# Patient Record
Sex: Female | Born: 1975 | Race: Black or African American | Hispanic: No | Marital: Married | Smoking: Current every day smoker
Health system: Southern US, Community
[De-identification: ages and names within clinical notes are randomized; demographics above are authoritative.]

## PROBLEM LIST (undated history)

## (undated) DIAGNOSIS — K219 Gastro-esophageal reflux disease without esophagitis: Secondary | ICD-10-CM

## (undated) DIAGNOSIS — F419 Anxiety disorder, unspecified: Secondary | ICD-10-CM

## (undated) DIAGNOSIS — I10 Essential (primary) hypertension: Secondary | ICD-10-CM

## (undated) DIAGNOSIS — F32A Depression, unspecified: Secondary | ICD-10-CM

## (undated) DIAGNOSIS — G4730 Sleep apnea, unspecified: Secondary | ICD-10-CM

## (undated) DIAGNOSIS — R51 Headache: Secondary | ICD-10-CM

## (undated) DIAGNOSIS — F329 Major depressive disorder, single episode, unspecified: Secondary | ICD-10-CM

## (undated) DIAGNOSIS — R519 Headache, unspecified: Secondary | ICD-10-CM

## (undated) HISTORY — DX: Depression, unspecified: F32.A

## (undated) HISTORY — DX: Essential (primary) hypertension: I10

## (undated) HISTORY — DX: Sleep apnea, unspecified: G47.30

## (undated) HISTORY — PX: BACK SURGERY: SHX140

## (undated) HISTORY — DX: Major depressive disorder, single episode, unspecified: F32.9

## (undated) HISTORY — PX: CHOLECYSTECTOMY: SHX55

## (undated) HISTORY — PX: COLONOSCOPY: SHX174

## (undated) HISTORY — DX: Headache, unspecified: R51.9

## (undated) HISTORY — DX: Gastro-esophageal reflux disease without esophagitis: K21.9

## (undated) HISTORY — PX: UPPER GASTROINTESTINAL ENDOSCOPY: SHX188

## (undated) HISTORY — DX: Headache: R51

## (undated) HISTORY — PX: ABDOMINAL HYSTERECTOMY: SHX81

---

## 1998-02-08 ENCOUNTER — Emergency Department (HOSPITAL_COMMUNITY): Admission: EM | Admit: 1998-02-08 | Discharge: 1998-02-08 | Payer: Self-pay

## 1998-02-17 ENCOUNTER — Emergency Department (HOSPITAL_COMMUNITY): Admission: EM | Admit: 1998-02-17 | Discharge: 1998-02-17 | Payer: Self-pay | Admitting: Emergency Medicine

## 1998-03-19 ENCOUNTER — Other Ambulatory Visit: Admission: RE | Admit: 1998-03-19 | Discharge: 1998-03-19 | Payer: Self-pay | Admitting: Obstetrics & Gynecology

## 2000-05-23 ENCOUNTER — Other Ambulatory Visit: Admission: RE | Admit: 2000-05-23 | Discharge: 2000-05-23 | Payer: Self-pay | Admitting: Family Medicine

## 2000-07-18 ENCOUNTER — Other Ambulatory Visit: Admission: RE | Admit: 2000-07-18 | Discharge: 2000-07-18 | Payer: Self-pay | Admitting: Obstetrics and Gynecology

## 2001-02-02 ENCOUNTER — Inpatient Hospital Stay (HOSPITAL_COMMUNITY): Admission: AD | Admit: 2001-02-02 | Discharge: 2001-02-05 | Payer: Self-pay | Admitting: Obstetrics and Gynecology

## 2001-11-22 ENCOUNTER — Other Ambulatory Visit: Admission: RE | Admit: 2001-11-22 | Discharge: 2001-11-22 | Payer: Self-pay | Admitting: Obstetrics and Gynecology

## 2001-12-26 ENCOUNTER — Encounter: Payer: Self-pay | Admitting: Obstetrics and Gynecology

## 2001-12-26 ENCOUNTER — Ambulatory Visit (HOSPITAL_COMMUNITY): Admission: RE | Admit: 2001-12-26 | Discharge: 2001-12-26 | Payer: Self-pay | Admitting: Obstetrics and Gynecology

## 2002-03-18 ENCOUNTER — Inpatient Hospital Stay (HOSPITAL_COMMUNITY): Admission: AD | Admit: 2002-03-18 | Discharge: 2002-03-18 | Payer: Self-pay | Admitting: Obstetrics and Gynecology

## 2002-05-07 ENCOUNTER — Inpatient Hospital Stay (HOSPITAL_COMMUNITY): Admission: AD | Admit: 2002-05-07 | Discharge: 2002-05-07 | Payer: Self-pay | Admitting: Obstetrics and Gynecology

## 2002-05-22 ENCOUNTER — Inpatient Hospital Stay (HOSPITAL_COMMUNITY): Admission: AD | Admit: 2002-05-22 | Discharge: 2002-05-22 | Payer: Self-pay | Admitting: Obstetrics and Gynecology

## 2002-05-22 ENCOUNTER — Encounter: Payer: Self-pay | Admitting: Obstetrics and Gynecology

## 2002-05-25 ENCOUNTER — Encounter (INDEPENDENT_AMBULATORY_CARE_PROVIDER_SITE_OTHER): Payer: Self-pay | Admitting: *Deleted

## 2002-05-25 ENCOUNTER — Inpatient Hospital Stay (HOSPITAL_COMMUNITY): Admission: AD | Admit: 2002-05-25 | Discharge: 2002-05-27 | Payer: Self-pay | Admitting: Obstetrics and Gynecology

## 2002-05-30 ENCOUNTER — Encounter: Payer: Self-pay | Admitting: Obstetrics and Gynecology

## 2002-05-30 ENCOUNTER — Inpatient Hospital Stay (HOSPITAL_COMMUNITY): Admission: AD | Admit: 2002-05-30 | Discharge: 2002-06-01 | Payer: Self-pay | Admitting: Obstetrics and Gynecology

## 2002-05-31 ENCOUNTER — Encounter: Payer: Self-pay | Admitting: Cardiology

## 2002-05-31 ENCOUNTER — Encounter: Payer: Self-pay | Admitting: Obstetrics and Gynecology

## 2003-02-13 ENCOUNTER — Encounter: Payer: Self-pay | Admitting: Obstetrics and Gynecology

## 2003-02-13 ENCOUNTER — Inpatient Hospital Stay (HOSPITAL_COMMUNITY): Admission: AD | Admit: 2003-02-13 | Discharge: 2003-02-13 | Payer: Self-pay | Admitting: Obstetrics and Gynecology

## 2003-02-28 ENCOUNTER — Encounter: Payer: Self-pay | Admitting: Obstetrics and Gynecology

## 2003-02-28 ENCOUNTER — Ambulatory Visit (HOSPITAL_COMMUNITY): Admission: RE | Admit: 2003-02-28 | Discharge: 2003-02-28 | Payer: Self-pay | Admitting: Obstetrics and Gynecology

## 2003-02-28 ENCOUNTER — Encounter (INDEPENDENT_AMBULATORY_CARE_PROVIDER_SITE_OTHER): Payer: Self-pay | Admitting: Specialist

## 2003-03-29 ENCOUNTER — Encounter: Admission: RE | Admit: 2003-03-29 | Discharge: 2003-03-29 | Payer: Self-pay | Admitting: Neurosurgery

## 2003-04-16 ENCOUNTER — Encounter: Admission: RE | Admit: 2003-04-16 | Discharge: 2003-04-16 | Payer: Self-pay | Admitting: Neurosurgery

## 2003-05-29 ENCOUNTER — Encounter: Admission: RE | Admit: 2003-05-29 | Discharge: 2003-05-29 | Payer: Self-pay | Admitting: Neurosurgery

## 2003-06-19 ENCOUNTER — Ambulatory Visit (HOSPITAL_COMMUNITY): Admission: RE | Admit: 2003-06-19 | Discharge: 2003-06-19 | Payer: Self-pay | Admitting: Obstetrics and Gynecology

## 2003-09-11 ENCOUNTER — Encounter
Admission: RE | Admit: 2003-09-11 | Discharge: 2003-12-10 | Payer: Self-pay | Admitting: Physical Medicine and Rehabilitation

## 2003-09-26 ENCOUNTER — Encounter
Admission: RE | Admit: 2003-09-26 | Discharge: 2003-12-25 | Payer: Self-pay | Admitting: Physical Medicine and Rehabilitation

## 2003-12-24 ENCOUNTER — Encounter
Admission: RE | Admit: 2003-12-24 | Discharge: 2004-01-29 | Payer: Self-pay | Admitting: Physical Medicine and Rehabilitation

## 2004-01-29 ENCOUNTER — Encounter
Admission: RE | Admit: 2004-01-29 | Discharge: 2004-03-24 | Payer: Self-pay | Admitting: Physical Medicine and Rehabilitation

## 2004-01-31 ENCOUNTER — Ambulatory Visit: Payer: Self-pay | Admitting: Physical Medicine and Rehabilitation

## 2004-03-10 ENCOUNTER — Encounter
Admission: RE | Admit: 2004-03-10 | Discharge: 2004-06-08 | Payer: Self-pay | Admitting: Physical Medicine and Rehabilitation

## 2004-03-24 ENCOUNTER — Encounter
Admission: RE | Admit: 2004-03-24 | Discharge: 2004-06-01 | Payer: Self-pay | Admitting: Physical Medicine and Rehabilitation

## 2004-06-01 ENCOUNTER — Encounter
Admission: RE | Admit: 2004-06-01 | Discharge: 2004-08-26 | Payer: Self-pay | Admitting: Physical Medicine and Rehabilitation

## 2004-06-03 ENCOUNTER — Ambulatory Visit: Payer: Self-pay | Admitting: Physical Medicine and Rehabilitation

## 2004-07-01 ENCOUNTER — Ambulatory Visit: Payer: Self-pay | Admitting: Physical Medicine and Rehabilitation

## 2004-07-14 ENCOUNTER — Inpatient Hospital Stay (HOSPITAL_COMMUNITY): Admission: AD | Admit: 2004-07-14 | Discharge: 2004-07-14 | Payer: Self-pay | Admitting: *Deleted

## 2004-07-30 ENCOUNTER — Ambulatory Visit: Payer: Self-pay | Admitting: Physical Medicine and Rehabilitation

## 2004-08-12 ENCOUNTER — Ambulatory Visit (HOSPITAL_COMMUNITY): Admission: RE | Admit: 2004-08-12 | Discharge: 2004-08-12 | Payer: Self-pay | Admitting: Neurosurgery

## 2004-08-26 ENCOUNTER — Encounter
Admission: RE | Admit: 2004-08-26 | Discharge: 2004-11-24 | Payer: Self-pay | Admitting: Physical Medicine and Rehabilitation

## 2004-10-06 ENCOUNTER — Ambulatory Visit (HOSPITAL_COMMUNITY): Admission: RE | Admit: 2004-10-06 | Discharge: 2004-10-07 | Payer: Self-pay | Admitting: Neurosurgery

## 2005-06-11 ENCOUNTER — Ambulatory Visit (HOSPITAL_COMMUNITY): Admission: RE | Admit: 2005-06-11 | Discharge: 2005-06-11 | Payer: Self-pay | Admitting: Nephrology

## 2005-06-16 ENCOUNTER — Ambulatory Visit (HOSPITAL_COMMUNITY): Admission: RE | Admit: 2005-06-16 | Discharge: 2005-06-16 | Payer: Self-pay | Admitting: Nephrology

## 2005-12-20 ENCOUNTER — Encounter (INDEPENDENT_AMBULATORY_CARE_PROVIDER_SITE_OTHER): Payer: Self-pay | Admitting: Specialist

## 2005-12-20 ENCOUNTER — Inpatient Hospital Stay (HOSPITAL_COMMUNITY): Admission: RE | Admit: 2005-12-20 | Discharge: 2005-12-22 | Payer: Self-pay | Admitting: Obstetrics and Gynecology

## 2006-06-03 ENCOUNTER — Emergency Department (HOSPITAL_COMMUNITY): Admission: EM | Admit: 2006-06-03 | Discharge: 2006-06-03 | Payer: Self-pay | Admitting: Emergency Medicine

## 2006-06-06 ENCOUNTER — Emergency Department (HOSPITAL_COMMUNITY): Admission: EM | Admit: 2006-06-06 | Discharge: 2006-06-06 | Payer: Self-pay | Admitting: Emergency Medicine

## 2006-06-24 ENCOUNTER — Encounter: Admission: RE | Admit: 2006-06-24 | Discharge: 2006-06-24 | Payer: Self-pay | Admitting: Gastroenterology

## 2006-08-10 ENCOUNTER — Emergency Department (HOSPITAL_COMMUNITY): Admission: EM | Admit: 2006-08-10 | Discharge: 2006-08-10 | Payer: Self-pay | Admitting: Emergency Medicine

## 2006-08-22 ENCOUNTER — Encounter: Admission: RE | Admit: 2006-08-22 | Discharge: 2006-08-22 | Payer: Self-pay | Admitting: Nephrology

## 2006-08-25 ENCOUNTER — Encounter: Admission: RE | Admit: 2006-08-25 | Discharge: 2006-08-25 | Payer: Self-pay | Admitting: Nephrology

## 2006-10-02 ENCOUNTER — Encounter: Admission: RE | Admit: 2006-10-02 | Discharge: 2006-10-02 | Payer: Self-pay | Admitting: Nephrology

## 2006-10-31 ENCOUNTER — Emergency Department (HOSPITAL_COMMUNITY): Admission: EM | Admit: 2006-10-31 | Discharge: 2006-10-31 | Payer: Self-pay | Admitting: Family Medicine

## 2007-03-19 ENCOUNTER — Emergency Department (HOSPITAL_COMMUNITY): Admission: EM | Admit: 2007-03-19 | Discharge: 2007-03-19 | Payer: Self-pay | Admitting: Family Medicine

## 2007-12-30 ENCOUNTER — Emergency Department (HOSPITAL_COMMUNITY): Admission: EM | Admit: 2007-12-30 | Discharge: 2007-12-30 | Payer: Self-pay | Admitting: Emergency Medicine

## 2008-02-02 ENCOUNTER — Encounter: Admission: RE | Admit: 2008-02-02 | Discharge: 2008-02-02 | Payer: Self-pay | Admitting: Nephrology

## 2009-03-28 ENCOUNTER — Encounter: Admission: RE | Admit: 2009-03-28 | Discharge: 2009-03-28 | Payer: Self-pay | Admitting: Family Medicine

## 2009-04-15 ENCOUNTER — Ambulatory Visit (HOSPITAL_COMMUNITY): Admission: EM | Admit: 2009-04-15 | Discharge: 2009-04-16 | Payer: Self-pay | Admitting: Emergency Medicine

## 2010-06-14 ENCOUNTER — Encounter: Payer: Self-pay | Admitting: Nephrology

## 2010-08-26 LAB — COMPREHENSIVE METABOLIC PANEL
Alkaline Phosphatase: 70 U/L (ref 39–117)
BUN: 5 mg/dL — ABNORMAL LOW (ref 6–23)
Calcium: 9.4 mg/dL (ref 8.4–10.5)
Creatinine, Ser: 0.69 mg/dL (ref 0.4–1.2)
Glucose, Bld: 107 mg/dL — ABNORMAL HIGH (ref 70–99)
Potassium: 3.8 mEq/L (ref 3.5–5.1)
Total Protein: 7 g/dL (ref 6.0–8.3)

## 2010-08-26 LAB — DIFFERENTIAL
Basophils Relative: 0 % (ref 0–1)
Lymphocytes Relative: 15 % (ref 12–46)
Monocytes Relative: 7 % (ref 3–12)
Neutro Abs: 7.2 10*3/uL (ref 1.7–7.7)
Neutrophils Relative %: 77 % (ref 43–77)

## 2010-08-26 LAB — CBC
HCT: 40.5 % (ref 36.0–46.0)
Hemoglobin: 13.6 g/dL (ref 12.0–15.0)
MCHC: 33.6 g/dL (ref 30.0–36.0)
MCV: 93.7 fL (ref 78.0–100.0)
Platelets: 147 10*3/uL — ABNORMAL LOW (ref 150–400)
RDW: 14 % (ref 11.5–15.5)

## 2010-08-26 LAB — URINALYSIS, ROUTINE W REFLEX MICROSCOPIC
Glucose, UA: NEGATIVE mg/dL
Protein, ur: NEGATIVE mg/dL
Specific Gravity, Urine: 1.012 (ref 1.005–1.030)
Urobilinogen, UA: 0.2 mg/dL (ref 0.0–1.0)

## 2010-10-09 NOTE — Op Note (Signed)
NAME:  Carla Wagner, Carla Wagner NO.:  [id number]   MEDICAL RECORD NO.:  [id number]                   PATIENT TYPE:  AMB   LOCATION:  DAY                                  FACILITY:  [facility]   PHYSICIAN:  Malachi Pro. Ambrose Mantle, M.D.              DATE OF BIRTH:  1976/04/26   DATE OF PROCEDURE:  02/28/2003  DATE OF DISCHARGE:                                 OPERATIVE REPORT   PREOPERATIVE DIAGNOSIS:  Incomplete abortion.   POSTOPERATIVE DIAGNOSIS:  Incomplete abortion.   OPERATION:  Suction dilatation and curettage.   OPERATOR:  Malachi Pro. Ambrose Mantle, M.D.   General anesthesia.   The patient was brought to the operating room and placed under satisfactory  general anesthesia and placed in lithotomy position.  Exam revealed the  uterus to be anterior, upper limit of normal size.  There were no adnexal  masses.  The cervix was closed.  There was blood in the vagina.  The vulva,  vagina, perineum, and urethra were prepped with Betadine solution and draped  as a sterile field.  By history the patient had undergone postpartum tubal  ligation on May 25, 2002.  She had an ultrasound on February 07, 2003,  that showed a fetal pole and a gestational sac equivalent to 5 weeks 6 days  pregnancy.  The pregnancy was intrauterine.  She had had three beta HCGs.  They were declining.  She continued to have bleeding, and an ultrasound on  February 26, 2003, showed what appeared to a gestational sac although it was  somewhat irregular, it could have just represented fluid, and there was no  fetal pole and no yolk sac.  Obviously there had been no progression in  almost three weeks from the time she had had a 5 week 6 day intrauterine  pregnancy seen at [facility].  The cervix was grasped with a  tenaculum, drawn into the operative field.  The uterus sounded to 11 cm  anteriorly.  The cervical canal was dilated to a 31 Pratt dilator.  A #8  curved suction curette was inserted and a  suction D&C was done, producing  tissue.  After I thought the tissue had been removed, we added Pitocin to  the IV bottle, used a sharp curette to ensure that the cavity walls felt  smooth, did one more circuit with the suction instrument, and terminated the  procedure as far as the suction was concerned.  There was significant  bleeding from the tenaculum sites, so I placed a 2-0 Vicryl suture at each  tenaculum site and controlled all the bleeding.  The procedure was  terminated.  Blood loss was about 100 mL.  Sponge and needle counts were  correct.  The patient was returned to recovery in satisfactory condition.  Malachi Pro. Ambrose Mantle, M.D.    TFH/MEDQ  D:  02/28/2003  T:  02/28/2003  Job:  [id number]

## 2010-10-09 NOTE — Assessment & Plan Note (Signed)
MEDICAL RECORD NUMBER:  [id number].   Carla Wagner is a 35 year old single black female who is a patient of Dr.  Cassandria Santee.   Carla Wagner is being seen in our pain and rehabilitative clinic for chronic  low back pain and right sciatic symptoms. These have persisted for her since  she has been initially seen her back last April of 2005.   She describes her average pain as about a 7 on a scale of 10. Describes it  as burning, stabbing, tingling, and aching. Ninety percent of her pain is  right leg pain, 10% is in her low back.   Sleep is overall fair. Pain is worsened by bending and sitting type  activities. Improves with heat, rest, therapy, and medications. Relief from  medications has overall been good although she is getting somewhat  frustrated with her persistent leg pain and feels her activities are limited  by the leg pain.   She is currently not employed. She has three children. She is independent  with all of her self care. She is interested in getting a job outside the  home, however.   Denies bowel or bladder control problems. Denies any suicidal ideation. Does  admit to anxiety and depression, however.   No new changes in review of systems, past medical, social, or family  history.   PHYSICAL EXAMINATION:  Blood pressure 124/69, pulse 61, respirations 16,  100% saturated on room air. Carla Wagner is a tall, well-developed, well-  nourished female, slightly obese. She does not appear in any distress during  our interview. She is oriented to person, place, and date. Her affect is  overall bright and alert. She is able to stand independently from a seated  position. Her gait is overall nonantalgic. She has increased low back pain  with forward flexion, not as much with extension or lateral flexion. Seated,  reflexes are 0 to 1+ at the knees and ankles. No increased tone is noted.  She has a positive straight leg raise on the right, negative on the left.  She does have some  gastroc weakness noted with two raises on the right. No  EHL weakness is noted, however.   IMPRESSION:  1.  Lumbago, 10% of problem.  2.  Sciatica, 90% of problem.   We will refill patient's medications today, Lidoderm 5% 1 to 3 patches 12  hours on/12 hours off as directed #90, Norco 5/325 one p.o. t.i.d. #90,  Prilosec 20 mg 1 p.o. q.d. #30. She also has reported that the Elavil is  helpful, but she did not need a refill on this today as well Topamax has  been quite helpful, and she will not need a refill on this to date. She is  taking 25 mg 1 p.o. b.i.d.   Apparently, Carla Wagner is getting frustrated with her continued right leg  pain. She is planning to follow up with Dr. Jeral Fruit in apparently the end of  March. We will send a copy of our note to Dr. Hilda Lias, [address], [address], [place], [place] [address].     DMK/MedQ  D:  07/03/2004 12:07:58  T:  07/03/2004 14:55:03  Job #:  [id number]

## 2010-10-09 NOTE — Assessment & Plan Note (Signed)
MEDICAL RECORD NUMBER:  [id number].   [name] is a patient of Dr. Cassandria Santee. She is back in for a  recheck and refill of her medications. She was last seen on March. At that  time, she had an appointment set up with Dr. Jeral Fruit. She saw him yesterday.  He is planning a surgical procedure for her lumbar spine on May 9 or 10  apparently.   She has had sciatic pain in her right pain over a year now. She has been  managed conservatively with therapy, medications, and has persistent leg  pain. Average pain is about a 7 on a scale of 10. Sleep is fair. Relief of  medications has been good lately. She recently started on Naprosyn. She  tolerated this well, and this did help her quite a bit last month.   Able to walk about 60 minutes at a time. Currently not employed. Has two  children at home. No problems controlling bowel or bladder. Does admit to  anxiety.   No new changes in past medical, social, or family history other than that  described above.   PHYSICAL EXAMINATION:  Blood pressure 132/82, pulse 74, respirations 16,  100% saturated on room air. She is alert, oriented, cooperative, pleasant  female. Affect overall bright. Oriented x3.   She is able to stand independently from a seated position. Gait is initially  slightly antalgic. There is limitations in lumbar range of motion. Seated  straight leg raise is positive on the right. She has numbness in the right  S1 distribution. She has 5/5 strength, however, in both lower extremities.   IMPRESSION:  1.  Lumbago.  2.  Sciatica.   PLAN:  Refill of the following medications:  1.  Naprosyn 500 mg 1 p.o. q.12h. #60.  2.  Norvasc 5/325 one p.o. t.i.d. #90.  3.  Topamax 25 mg 1 p.o. b.i.d. #60.  4.  Effexor samples are given today.  5.  Lidoderm 5% 1 to 3 patches 12 hours on and 12 hours off, #60.   Will see her back in a month.      DMK/MedQ  D:  08/28/2004 11:29:31  T:  08/28/2004 15:56:30  Job #:  [id number]

## 2010-10-09 NOTE — Op Note (Signed)
NAME:  Carla Wagner, Carla Wagner NO.:  [id number]   MEDICAL RECORD NO.:  [id number]                   PATIENT TYPE:  AMB   LOCATION:  DAY                                  FACILITY:  [facility]   PHYSICIAN:  Malachi Pro. Ambrose Mantle, M.D.              DATE OF BIRTH:  1975-08-28   DATE OF PROCEDURE:  06/19/2003  DATE OF DISCHARGE:                                 OPERATIVE REPORT   PREOPERATIVE DIAGNOSIS:  Voluntary sterilization.   POSTOPERATIVE DIAGNOSIS:  Voluntary sterilization.   OPERATION:  Laparoscopic tubal cauterization.   OPERATOR:  Malachi Pro. Ambrose Mantle, M.D.   ANESTHESIA:  General.   DESCRIPTION OF PROCEDURE:  The patient was brought to the operating room and  placed under satisfactory general anesthesia and placed in the [facility]  stirrups.  The vulva, vagina, and urethra were prepped with Betadine  solution.  The abdomen was emptied with a [place] catheter.  Exam revealed  the uterus to be anterior and normal size.  The adnexa were free of masses.  A Hulka cannula was placed into the uterus and attached to the anterior  cervical lip.  The abdomen was then prepped with Betadine solution and  draped as a sterile field.  Because the patient had had a previous tubal  procedure through the umbilicus, I chose to do an open laparoscopy, but the  distance from the umbilicus to the pubis was fairly short, so I chose to  enter the abdomen through a supraumbilical incision rather than  infraumbilical incision.  The patient had had a history of having her tubes  tied following her last delivery.  She then became pregnant and ultimately  required a D&C for an incomplete abortion.  Because we knew that at least 1  tube was open, that we needed to do a sterilization if she so desired, and  she did.  So, the skin above the umbilicus was grasped with Allis clamps.  An incision was made in the skin about 1 inch long, and then I carried this  down to the fascia with a ___________ clamp.   I then grasped the fascia with  2 Kochers, incised the fascia and then with blunt dissection was able to  enter the peritoneal cavity.  I placed a pursestring suture of #1 Vicryl  using a urology needle all the way around the fascial opening and then  inserted the Hasson cannula and attached the suture to the Hasson.  I  inflated the abdominal cavity and made another incision suprapubically and  inserted a smaller trocar under direct vision.  Exploration of the pelvis  revealed the ovaries to look normal.  The uterus was normal.  Both cul-de-  sacs were free of disease.  The right tube had obviously been divided, but  it appeared that you could see the endosalpinx both proximally and distally.  It appeared red rather than pink or beige-white, as the peritoneum of  the  tubal serosa should look.  The left tube had obviously been divided.  There  was no endosalpinx visible on the left.  There were some filmy adhesions  connecting the proximal end with the distal end but just judging by the  appearance, I would think that the right tube was the one that had allowed  the pregnancy to occur, but it was impossible to say.  So I cauterized the  proximal aspect of both fallopian tubes with 3-4 continuous blasts of  current until the meter read zero, and I also coagulated a short segment of  the distal right fallopian tube where I could see the endosalpinx.  I looked  superiorly and saw the liver to be smooth without abnormality.  The  gallbladder looked normal.  I did not see any upper abdominal abnormalities.  I removed the lower abdominal trocar, saw no bleeding from this entry point,  released the pneumoperitoneum, removed the Hasson cannula, tied down the  fascia pursestring suture and closed the fascial incision in this manner.  I  then closed the subcutaneous tissue with interrupted 3-0  Vicryl and the skin with 3-0 plain.  I closed the lower abdominal puncture  wound with 1/8 inch  Steri-Strips.  I removed the Hulka cannula.  The patient  was returned to recovery in satisfactory condition.  Blood loss was less  than 10 mL.                                               Malachi Pro. Ambrose Mantle, M.D.    TFH/MEDQ  D:  06/19/2003  T:  06/19/2003  Job:  [id number]

## 2010-10-09 NOTE — Assessment & Plan Note (Signed)
Carla Wagner is a 35 year old single black female referred by Dr. Jeral Fruit for  management of her back pain.   She was last seen March 26, 2004. She had her last refill of hydrocodone  and Naprosyn on April 29, 2004.   She has had a series of three lumbar injections in the past and reported  they have helped somewhat. She is wondering if she could followup with  another injection. She is also asking about possible surgical intervention  since this has been going on for her now for over a year.   Her pain is fairly constant localized in the right low back, radiates down  the right leg, worse with bending, sitting type activities. The pain is on  the average about a 7 on a scale of 10 described as sharp, burning,  stabbing, tingling and aching.  Sleep is overall fair. She gets fairly good  relief with her medication.  Interferes a lot with her daily activities and  completely interferes with her enjoyment of life.   She is able to walk without assistance, can walk about 10 minutes at a time.  She is able to climb stairs. She is able to drive. She is independent with  her self care, does require some assistance with household duties and  shopping. She is currently not employed. Denies any suicidal ideation.   On review of systems, she reports some intermittent shortness of breath. She  will followup with her primary care physician for this particular symptom.   Denies any new problems regarding her past medical history, social history  or family history.   PHYSICAL EXAMINATION:  GENERAL:  She is an alert, oriented individual.  Her  affect is overall alert and bright.  VITAL SIGNS:  Her blood pressure is 133/76, pulse 77, respirations 18, 100%  saturated on room air.   She is able to get out of the chair without any difficulty, gait is  initially somewhat stiff but it evens out in the room.  She has some  limitations with forward flexion, extension, lateral flexion.  Seated  reflexes are 2+ at the knees, 1+ at the ankles, motor strength is 5/5, hip  flexors and knee extensors, dorsiflexors, plantar flexors, EHL, straight leg  raise increases her pain on the right.  Had her do toe raises in the room  today. She has some difficulty on the right compared to the left and has  positive straight leg raise on the right.   IMPRESSION:  1.  Lumbago.  2.  Degenerative disk disease especially at L3-4, L4-5.  3.  Sciatica with a history of right S1 root compromise secondary to L5      disk.   PLAN:  She would like to followup with [facility] and find out what her surgical  options are.  Will get her setup for a selective nerve root block again as  well.  We will refill the following medications for her today:   1.  Effexor XR 75, 1 p.o. q.d. #30.  2.  Topamax 1 p.o. b.i.d, #60.  3.  Elavil 10 mg 1 p.o. q.h.s. #30.  4.  Norco 5/325, 1 p.o. t.i.d. #90.  5.  Lidoderm patches 5%, 1-3 patches, 4 hours, 2 hours off, #90.  Will her      back in a month.       DMK/MedQ  D:  06/03/2004 17:27:18  T:  06/03/2004 21:07:59  Job #:  [id number]   cc:   Hilda Lias,  M.D.  [address]  [place], [place] [address]  Fax: [phone]

## 2010-10-09 NOTE — Assessment & Plan Note (Signed)
MEDICAL RECORD NUMBER:  [id number]   HISTORY:  [name] is a 35 year old woman with a history of degenerative  disc disease at L3-4, L4-5 and history of some right S1 root compromise  secondary to an L5-S1disc.   Her average pain is about a 4 on a scale of 10, can go up to an 8 and can  down to a 4.   She remains quite active.  She is the mother of a 10- and a 61-year-old.  She  has been going to physical therapy and has been doing some spine  stabilization work in physical therapy.   She has reported some increased pain in her low back over the last several  weeks, however, she is quite adamant and wanting to continue on a  conservative management course.  Again we offered the possibility of doing  some spine injections, epidural's and selective nerve root blocks, she is  not interested and also is following up with Dr. Jeral Fruit at this point, she  would like to wait to see if she has some improvement again.   She is specifically asking for a physical therapy program to help her with  weight loss.   She has had injections in the past, her last one was done December 2004, she  had had a total of three I believe although I do not have notes regarding  this, she reports they did help somewhat.   She has been managed currently with hydrocodone 5/325 three times a day.  She has been quite compliant.  There has been no escalation with these  medications.  We have had to put her on a couple courses of Naprosyn for  short periods of time.  She seems to do well with this.  She takes Prilosec  as well during these short courses.  She is requesting to get involved in  the water aerobics program, we will write a prescription out for her to do  that as well today.   PHYSICAL EXAMINATION:  On exam her blood pressure is 129/61, pulse 57,  respirations 14, 100% saturated on room air.   She is alert, cooperative, in good spirits overall.   Heart is in regular rhythm.  Lungs are clear.  Abdomen  is benign.   Extremities are without edema, no tremors are noted, normal tone is noted.   Her gait is slightly antalgic with slightly decreased weightbearing on the  right.  It does smooth out a little bit when she takes a few more steps in  the room.  It is however, stable.   Neurologically she has 1 to 2+ reflexes at the biceps, triceps and  brachioradialis bilaterally, 0 to 1+ at the knees and ankles bilaterally,  toes are downgoing, no clonus is noted.  We specifically test her gastroc  strength having her do toe raises in the room, there is no weakness noted at  all, she is able to do as many on the right as on the left, no new numbness  is noted.  EHL and ankle dorsiflexors and evertors are all 5/5 strength as  well.  There is no sensory deficits in the lower extremities noted either  today.   IMPRESSION:  Degenerative disc disease at L3-4, L4-5 and right S1 nerve root  compromise secondary to L5-S1 disc.  No evidence of any neurologic deficits.  No subtle weakness is noted side to side.  No sensory deficits are noted  either today.   PLAN:  We will start  her on another 10 days of Naprosyn with Prilosec.  We  will refill her Norco 5/325 one p.o. t.i.d.  We will write her a  prescription for engaging in physical therapy in a conditioning program to  help with  weight loss.  We will also give her a prescription to attend a local [employer]  for a water aerobic as well.  We will see her back in 1 month.      Brantley Stage, M.D.   DMK/MedQ  D:  12/25/2003 13:06:14  T:  12/26/2003 06:50:14  Job #:  [id number]   cc:   Hilda Lias, M.D.  [address]  [place], [place] [address]  Fax: [phone]

## 2010-10-09 NOTE — Discharge Summary (Signed)
   NAME:  Carla Wagner, CLINKENBEARD NO.:  [id number]   MEDICAL RECORD NO.:  [id number]                   PATIENT TYPE:  INP   LOCATION:  9140                                 FACILITY:  [facility]   PHYSICIAN:  Janine Limbo, M.D.            DATE OF BIRTH:  Nov 23, 1975   DATE OF ADMISSION:  05/25/2002  DATE OF DISCHARGE:  05/27/2002                                 DISCHARGE SUMMARY   ADMISSION DIAGNOSES:  1. Intrauterine pregnancy at term.  2. Active labor.  3. Desires sterilization.   PROCEDURE:  Normal spontaneous vaginal deliver and postpartum bilateral  tubal sterilization.   DISCHARGE DIAGNOSES:  1. Active labor at term delivered.  2. Meconium-stained amniotic fluid.   HOSPITAL COURSE:  The patient is a 35 year old gravida 6, para 2-0-2-2 who  presented at term with spontaneous rupture of membranes for meconium-stained  amniotic fluid.  Her labor progressed rapidly and she went on to a normal  spontaneous vaginal delivery with the birth of a 9 pound 6 ounce female infant  named [name] with Apgar scores of 8 at one-minute and 9 at five minutes.  The  patient underwent postpartum bilateral tubal sterilization on her day of  delivery and has done well in the immediate postpartum period.  Her vital  signs have remained stable.  She is afebrile.  She was anemic on admission  with hemoglobin of 9.9 and on her first postpartum day her hemoglobin was  6.7.  She has not had any dizziness or syncope, has been up and ambulated  without any difficulty, and declined blood transfusion.  She has been stable  and her baby is stable.  She is therefore in satisfactory condition for  discharge.   DISCHARGE INSTRUCTIONS:  Per [facility] handout.   DISCHARGE MEDICATIONS:  1. Motrin 600 mg p.o. q.6 h. p.r.n. pain.  2. Prenatal vitamins.  3. The patient will take iron 325 mg p.o. b.i.d.   FOLLOW UP:  Return to the office of [facility] in six weeks for followup.     Rica Koyanagi, C.N.M.               Janine Limbo, M.D.    SDM/MEDQ  D:  05/27/2002  T:  05/27/2002  Job:  [id number]

## 2010-10-09 NOTE — Op Note (Signed)
NAME:  Carla Wagner, Carla Wagner NO.:  [id number]   MEDICAL RECORD NO.:  [id number]          PATIENT TYPE:  AMB   LOCATION:  SDC                           FACILITY:  [facility]   PHYSICIAN:  Malachi Pro. Ambrose Mantle, M.D. DATE OF BIRTH:  1976/03/05   DATE OF PROCEDURE:  12/20/2005  DATE OF DISCHARGE:                                 OPERATIVE REPORT   PREOPERATIVE DIAGNOSIS:  Menorrhagia, dysmenorrhea, anemia, probable  adenomyosis.   POSTOPERATIVE DIAGNOSIS:  Menorrhagia, dysmenorrhea, anemia, probable  adenomyosis.   OPERATION:  Vaginal hysterectomy.   OPERATOR:  Malachi Pro. Ambrose Mantle, M.D.   ASSISTANT:  Zenaida Niece, M.D.   General anesthesia.   The patient was brought to the operating room and placed under satisfactory  general anesthesia.  She was placed in lithotomy position in the [place]  stirrups.  Exam revealed the uterus to the anterior upper limit of normal  size.  The adnexa were free of masses.  The vulva, vagina, perineum and  urethra were prepped with Betadine solution.  A Foley catheter was inserted  to straight drain and then the area was draped as a sterile field.  A  weighted speculum was placed posteriorly.  Deaver retractor was placed  anteriorly.  The cervix was grasped with Lahey clamps and drawn into the  operative field.  A dilute solution of Neo-Synephrine was injected at the  cervicovaginal junction, approximately 12-15 mL was injected.  This solution  was 0.4 mg of Neo-Synephrine in 250 mL of saline.  A circumferential  incision was made around the cervix.  The anterior vaginal mucosa was pushed  anteriorly and then the posterior cul-de-sac was identified and entered.  Both uterosacral ligaments were clamped, cut, suture ligated and held and  the cardinal ligaments were handled in the same fashion except they were  cut.  The anterior vaginal mucosa was pushed ahead and I made an attempt to  enter the peritoneum, was unable to do that so I placed two more  clamps  along the previous ones clamped, cut and suture ligated the clamps and then  the anterior peritoneum was entered and the bladder was retracted away.  I  confirmed the presence of the retractor in the peritoneal cavity by  identifying bowel.  I then placed a couple more clamps above this area and  suture ligated the clamps, then inverted the uterus through the cul-de-sac,  clamped across the upper pedicles and then doubly suture ligated all the  upper pedicles.  I ran the posterior vaginal cuff with a locked suture of 0  Vicryl.  However, in spite of this I still had a couple of areas of bleeding  that required a couple of extra figure-of-eight sutures of 0 Vicryl.  I  diligently searched for hemostasis, a couple of bleeders on each side above  the uterosacral ligaments were sutured with 0 Vicryl figure-of-eight  sutures.  Hemostasis was now adequate.  Both tubes and ovaries appeared  normal.  I identified the anterior peritoneal reflection.  I began a  pursestring suture of #1 Vicryl through that and carried it around to  the  left upper pedicle, the left uterosacral ligament, posterior cul-de-sac,  peritoneum, the right uterosacral ligament, the right upper pedicle and back  to the anterior pelvic peritoneum.  I left this suture untied, looked again  for hemostasis, found that the area was hemostatic, tied the pursestring  suture down ensuring that there was no significant structure in its way and  then reunited the uterosacral ligaments together in the midline by tying  down the sutures that I had placed on  them earlier.  Then reunited the vaginal mucosa with interrupted figure-of-  eight sutures of 0 Vicryl.  The patient seemed to tolerate the procedure  well.  Blood loss was about 200 mL.  Sponge and needle counts were correct.  Urine was clear at the end of the procedure.      Malachi Pro. Ambrose Mantle, M.D.  Electronically Signed     TFH/MEDQ  D:  12/20/2005  T:  12/20/2005   Job:  [id number]

## 2010-10-09 NOTE — Discharge Summary (Signed)
NAME:  Carla Wagner, Carla Wagner NO.:  [id number]   MEDICAL RECORD NO.:  [id number]                   PATIENT TYPE:  INP   LOCATION:  9304                                 [facility]ILITY[facility]   PHYSICIAN:  Carla Wagner, M.D.              DATE OF BIRTH:  [birth year]   DATE OF ADMISSION:  05/30/2002  DATE OF DISCHARGE:  06/01/2002                                 DISCHARGE SUMMARY   ADMISSION DIAGNOSES:  1. Postpartum day #5.  2. Hypertension.  3. Pulmonary edema and chest pain.   DISCHARGE DIAGNOSES:  1. Postpartum day #7.  2. Mild hypertension with mild fluid overload.  3. Severe anemia.  4. Possible mild cardiomyopathy.  5. Bilateral pleural effusions.  6. Upper respiratory infection.   HOSPITAL PROCEDURES:  1. Echocardiogram.  2. Chest x-ray.  3. Diuresis.   HOSPITAL COURSE:  The patient was admitted on postpartum day #5 with  complaint of chest pain for several days.  She had a history of labile blood  pressures during pregnancy but was never diagnosed with preeclampsia and had  a vaginal delivery.  She was admitted to the hospital with blood pressures  of 150 over 93 to 100, heart rate 89.  Pulse oximetry was 99-100% on room  air.  Lungs were clear to auscultation.  Urine was negative for protein.  She had mild bilateral lower extremity edema.  Hemoglobin was 7.1, revealing  postpartum anemia.  Chemistries were within normal limits.  Chest x-ray  showed prominent heart size with mild vascular congestion and small  bilateral pleural effusions.  She was treated with IV Lasix for diuresis and  labetalol for blood pressure control.  She had a cardiac consult by Dr.  Patty Sermons from [facility] who felt that she had mild  hypertension and mild fluid overload, severe anemia, and cardiomegaly  consistent with postpartum state.  He agreed with Lasix and labetalol for  control.  He did a 2-D echocardiogram which showed left ventricular size  which was upper limit of normal, left ventricular systolic function which  was in the lower limits of normal, ejection fraction 50-55%, no evidence of  wall motion abnormalities, and a trivial mitral regurgitation.  She had 4  pounds of diuresis in 24 hours.  Blood pressures measured in the range of  130 to 168 over 80 to 102.  She did spike a fever to 101.2 at midnight of  June 01, 2002 which was felt to be upper respiratory infection.  She was  also coughing up green sputum and was started on Keflex for the URI.  On  hospital day #2 she was seen by Dr. Estanislado Pandy and had defervesced since  midnight.  Weight was down by 4 pounds.  Heart rate was regular.  Blood  pressure was mildly elevated.  It was recommended by cardiology that she  follow up in their office in two weeks and  decision was made that she had  received the full benefit of her hospital stay, and she was discharged home.   DISCHARGE MEDICATIONS:  1. Hydrochlorothiazide 25 mg once daily.  2. Labetalol 100 mg twice daily.  3. Keflex 500 mg p.o. q.i.d. x7 days.  4. Continue with incentive spirometry.  5. Continue with her twice-daily iron supplements for her anemia.   DISCHARGE LABORATORY DATA:  White blood cell count 10.6, hemoglobin 7.3,  hematocrit 22.2, platelets 257.   DISCHARGE INSTRUCTIONS:  Include medication administration, incentive  spirometry, and reporting of any signs and symptoms of increased chest pain  or dyspnea.   DISCHARGE FOLLOW-UP:  In two weeks with cardiology and four weeks at [facility] or p.r.n. as indicated.     Carla Wagner, C.N.M.                 Carla Fat Wagner, M.D.    MLW/MEDQ  D:  06/01/2002  T:  06/01/2002  Job:  [id number]

## 2010-10-09 NOTE — H&P (Signed)
[facility]  Patient:    Carla Wagner, Carla Wagner Visit Number: [id number] MRN: [id number]          Service Type: Attending:  Maris Berger. Pennie Rushing, M.D. Dictated by:   Vance Gather Duplantis, C.N.M. Adm. Date:  02/02/01                           History and Physical  HISTORY OF PRESENT ILLNESS:   The patient is a 35 year old single black female, gravida 2, para 1-0-2-1 at [redacted] weeks gestation today who presents complaining of leaking greenish fluid at 1045 p.m. on February 02, 2001.  She denies any regular uterine contractions.  She reports positive fetal movement. She denies any nausea, vomiting, headaches or visual disturbances.  her pregnancy has been followed at [facility] by the M.D. service and has been essentially uncomplicated, though at risk for 1) History of smoking in early pregnancy; 2) History of abnormal Pap and 3) History of laser surgery on her cervix.  Her group B strep is negative.  OB/GYN HISTORY:               She is gravida ______, para 1-0-2-1.  She had an elective AB in 1990 and in 1997 with no complications.  In January 1997, she delivered a viable female infant who weighed 7 lb 13 oz at [redacted] weeks gestation following a six-hour labor.  She delivered vaginally with Stadol and Demerol for pain medication.  She reports that she had meconium-stained fluid and possibly polyhydramnios with that labor, also.  ALLERGIES:                    No known drug allergies.  PAST MEDICAL HISTORY:         She reports having had the usual childhood diseases.  She reports occasional urinary tract infection.  Her only hospitalization was for childbirth.  GENETIC HISTORY:              Negative.  FAMILY HISTORY:               Significant for maternal grandfather with MI, father with some kind of cardiac disease, mother with hypertension requiring medications, maternal grandmother has varicose veins, mother has non-insulin-dependent diabetes  mellitus.  SOCIAL HISTORY:               She is single.  The father of the baby is Carla Wagner.  He is involved and supportive.  She is unemployed.  He is employed full time.  They are of the [place] faith.  They deny any illicit drug use or alcohol.  They report discontinuing smoking after early pregnancy.  PRENATAL LABORATORY DATA:     Blood type O positive.  Antibody screen negative.  Sickle-cell trait negative.  Syphilis nonreactive.  Rubella positive.  Hepatitis B surface antigen negative.  Varicella immune.  GC and Chlamydia both negative.  Pap showed benign reactive changes.  One-hour Glucola within normal range.  Maternal serum alpha-fetoprotein also within normal range.  Her 36-week Beta strep was negative.  PHYSICAL EXAMINATION:  VITAL SIGNS:                  Stable and afebrile.  HEENT:                        Grossly within normal limits.  HEART:  Regular rate and rhythm.  CHEST:                        Clear.  BREASTS:                      Soft and nontender.  ABDOMEN:                      Gravid with uterine contractions that are mild and irregular about every 10-12 minutes. Fetal heart rate is reactive and reassuring.  PELVIC:                       Moderately thick, green, meconium-stained fluid noted externally.  Non internal pelvic was done at this time, though she is vertex by Le[place] and reports an ultrasound in the office today that also verified vertex position.  EXTREMITIES:                  Within normal limits.  ASSESSMENT:                   1. Intrauterine pregnancy at term.                               2. Spontaneous rupture of membranes with                                  moderate meconium-staine fluid at 10:45 p.m.                               3. Negative group B strep.  PLAN:                         1. Admit to labor and delivery.                               2. Notify Dr. Dierdre Forth of the patients                                   admission.  She will follow the patient. Dictated by:   Vance Gather Duplantis, C.N.M. Attending:  Maris Berger. Pennie Rushing, M.D. DD:  02/03/01 TD:  02/03/01 Job: [id number]

## 2010-10-09 NOTE — H&P (Signed)
NAME:  ASHMI, BLAS NO.:  [id number]   MEDICAL RECORD NO.:  [id number]          PATIENT TYPE:  AMB   LOCATION:  SDC                           [facility]   PHYSICIAN:  Malachi Pro. Ambrose Mantle, M.D. DATE OF BIRTH:  06-21-1975   DATE OF ADMISSION:  12/20/2005  DATE OF DISCHARGE:                                HISTORY & PHYSICAL   HISTORY OF PRESENT ILLNESS:  This is a 35 year old black female, para 3-0-1-  3, who was admitted to the hospital for hysterectomy because of menorrhagia,  dysmenorrhea, probable adenomyosis, history of anemia to 8.5 g.  The  patient's periods have occurred at approximately 28-day intervals, last 7  days.  She describes heavy flow using 8-10 pads a day, describing  dysmenorrhea 8 out of 10 with minimal relief from Motrin and Anaprox.  The  patient had a hemoglobin of 8.5 on August 13, 2005.  An ultrasound failed to  show any intrauterine abnormality in the cavity.  She is admitted now for  hysterectomy.  Her last period was November 22, 2005.   PAST MEDICAL HISTORY:  1.  Tubal ligations in January 2004 and August 2004.  2.  Back surgery in 2006.  3.  D&C in 2004.  4.  The patient has had no major adult illnesses, although she did have      postpartum hypertension.  She was evaluated for cardiomyopathy, and      ejection fraction was 50-55%.   The patient has a history of no known drug allergies.   FAMILY HISTORY:  Mother is 62 with diabetes and high blood pressure.  The  father is 46 with high blood pressure.  One brother, 36, with asthma.  The  patient has 3 living children, 29 years old, 3 years and 29 months old, and 37  years old.   MEDICATIONS:  Include Zyban, Prilosec, and Valtrex.  She takes Valtrex 1 g  daily.   PHYSICAL EXAMINATION:  GENERAL:  Well-developed, well-nourished black female  in no distress.  VITAL SIGNS:  Blood pressure 124/82, pulse 80, weight 183 pounds.  HEAD, EYES, EARS, NOSE, AND THROAT:  Reveal no cranial  abnormalities.  Extraocular movements are intact.  Nose and pharynx are clear.  NECK: Supple without thyromegaly.  HEART: Normal size and sounds.  No murmurs.  LUNGS: Clear to auscultation.  BREASTS:  Soft without masses.  ABDOMEN: Soft and nontender.  No masses are palpable.  The liver, spleen,  and kidneys are not felt.  PELVIC:  Vulva and vagina are clean.  Pap smear in April 2007 was within  normal limits.  The cervix is clean.  The uterus is anterior and upper limit  of normal size.  The adnexa clear of masses.  RECTAL:  Exam confirms that there is no cul-de-sac abnormality.   ADMITTING IMPRESSION:  1.  Menorrhagia.  2.  Dysmenorrhea.  3.  History of severe anemia.  4.  Probable adenomyosis.   The patient is admitted for attempt at vaginal hysterectomy.  If that is not  successful, will proceed to abdominal hysterectomy.  The patient has  been  informed of the risks of surgery to include but not limited to heart attack,  stroke, pulmonary embolus, wound disruption, hemorrhage with need for repeat  operation and/or transfusion, fistula formation, nerve injury, and  intestinal obstruction.  She also has been informed that the surgery might  cause a change in her orgasm, that the contribution of the uterus will no  longer be present.      Malachi Pro. Ambrose Mantle, M.D.  Electronically Signed     TFH/MEDQ  D:  12/19/2005  T:  12/19/2005  Job:  [id number]

## 2010-10-09 NOTE — Discharge Summary (Signed)
NAMEROSELENE, GRAY NO.:  [id number]   MEDICAL RECORD NO.:  [id number]          PATIENT TYPE:  INP   LOCATION:  9309                          [facility]   PHYSICIAN:  Malachi Pro. Ambrose Mantle, M.D. DATE OF BIRTH:  1975/05/29   DATE OF ADMISSION:  12/20/2005  DATE OF DISCHARGE:  12/22/2005                                 DISCHARGE SUMMARY   A 35 year old black female admitted for vaginal hysterectomy because of  menorrhagia, dysmenorrhea, anemia to 8.5 g and probable adenomyosis.  The  patient underwent a vaginal hysterectomy by Dr. Ambrose Mantle on the December 20, 2005.  She did well postoperatively.  Did not feel well enough to go home on the  first postoperative day, was ready for discharge on the second postoperative  day.  She was ambulating well without difficulty, tolerating a diet.  She  had passed flatus.  She was voiding well.  Her abdomen was soft and  nontender and she was ready for discharge.  Initial hemoglobin was 10.6,  hematocrit 32.5, white count 6600, platelet count 212,000.  Follow-up  hemoglobin on the first postoperative day was 8.3 with a hematocrit of 25.9  and 12 hours later the hemoglobin was 7.9 with hematocrit of 24.7.  There  were 50 segs, 33 lymphs, 12 monos, 5 eosinophils.  Comprehensive metabolic  profile was normal except for an ALT of 62 and I have advised this to be  repeated in two to six weeks postoperatively.  Chest x-ray and EKG were not  done.  Path report showed uterus weighing 169 g with benign cervix and mild  chronic cervicitis, benign weakly secretory endometrium, benign unremarkable  myometrium, and serosal fibrous adhesions.   FINAL DIAGNOSES:  1.  Menorrhagia.  2.  Dysmenorrhea.  3.  History of severe anemia to 8.5 g.   OPERATION:  Vaginal hysterectomy.   FINAL CONDITION:  Improved.  Instructions include our regular discharge  instructions.  No vaginal entrance, no heavy lifting or strenuous activity.  Call with any  temperature elevation greater than 100.4 degrees.  Call with  any unusual problems.  Percocet 5/325 30 tablets one or two every four to  six hours as needed for pain and the patient is advised to take ferrous  sulfate 325 mg twice a day to build her blood count.  She is to return to  the office in two weeks for follow-up examination.      Malachi Pro. Ambrose Mantle, M.D.  Electronically Signed     TFH/MEDQ  D:  12/22/2005  T:  12/22/2005  Job:  [id number]

## 2010-10-09 NOTE — Group Therapy Note (Signed)
REFERRAL SOURCE:  Hilda Lias, M.D.   [name] is a 35 year old woman referred by Dr. Jeral Fruit for evaluation and  treatment of low back pain and right lower extremity radiculopathy.   She had a gradual onset of worsening low back and right leg pain which began  during her last pregnancy over approximately a year ago.  After the baby's  birth, she had continued worsening of the pain.  She was seen by Dr. Jeral Fruit  in October of 2004.  MRI and x-rays were obtained.  Her MRI on March 12, 2003, showed mild spondylosis, right S1 root compression, and an L5-S1 right  paracentral disk herniation.  On February 19, 2003, she had lumbar spine  films, which showed per Dr. Cassandria Santee reading, mild facet arthropathy at L4-5  and L5-S1.   She reports that she has undergone several lumbar injections.  She is not  clear on how many she has had and exactly where they were.  She believes  they might have been done by Dr. Ethelene Hal.  She believes that her last  injection was in December of 2004.  She reports that she has had good  experience with these and has felt that they did help her quite a bit.   She describes her pain as being located in the right low back area and  radiating down into the foot.  She rates her pain in the low back as about a  5/6 on a scale of 10 and it is fairly constant.  Her leg pain, which is  intermittent and depends on whether or not she been sitting, can go up to an  8 or 9.  It is not constant and it waxes and wanes, but when it is there, it  is fairly intense pain for her.   She denies any problems with numbness or tingling.  She denies any bowel or  bladder control problems.  She has noticed some weakness in her right foot  on formal testing, but not during any functional situations that she can  recall.   She remains very active.  She cares for a 37 year old, 37 year old, and an 38  year old and is independent with all of her activities of daily living and  mobility.   She is also caring for these small children, including bathing  them, dressing them, feeding them, etc.  She also drives.  She does not have  a problem with that most days.  She grocery shops and helps out with home  tasks.   Overall she says that her emotional status is generally upbeat.  She denies  harm to herself or others.   SOCIAL HISTORY:  She currently lives with her mother, father, and three  children.  She rarely uses alcohol.  She smokes a packs of cigarettes every  three days.  She is single.  She last worked at [employer] in August of  2004.   FAMILY HISTORY:  Her mother is basically healthy at age 6.  She has some  diabetes, as well as previous spine surgery.  Her father is 96 and healthy.  She has a brother with prostate cancer.   ALLERGIES:  She denies any particular drug allergies.   REVIEW OF SYSTEMS:  Positive for some weight loss.  She attributes it to  poor appetite because of her back pain in the recent months.   PAST MEDICAL HISTORY:  She otherwise has no major medical problems.  She has  had a tubal ligation.   CURRENT MEDICATIONS:  Medications at this time include hydrocodone 5/500 mg  one p.o. q.i.d. and a multivitamin.   PHYSICAL EXAMINATION:  GENERAL APPEARANCE:  She is a well-developed, well-  nourished woman.  She does not appear uncomfortable during our interview.  Her affect is appropriate.  Her mood is bright.  She is cooperative.  VITAL SIGNS:  Her blood pressure is 134/81 and pulse is 71.  She is 100%  saturated on room air.  HEART:  In regular rhythm.  LUNGS:  Clear.  ABDOMEN:  Benign.  EXTREMITIES:  Remarkable for normal tone.  No tremors are noted.  No edema.  No color changes.  No tenderness.  Pulses are intact.  NEUROLOGIC:  She is able to get off of the exam table easily.  She does not  appear particularly stiff today.  She walks in the room with a normal gait.  Her range of motion in her lumbar spine is approximately 40 degrees  with  forward flexion and has limited extension.  She has increased pain in the  low back with right lateral flexion and about 10 degrees of left lateral  flexion.  Her reflexes were 1+ at both knees and ankles.  Toes were  downgoing.  No clonus was noted.  Testing sensation with light touch and  pinprick reveal no deficits.  Motor Strength:  She has very slight weakness  in the right EHL compared to left.  It is very slight.  She does toe raises  on right and left and has some difficulty on the right compared to the left  with a little bit of weakness there.   IMPRESSION:  1. Right S1 root compression secondary to L5-S1 disk protrusion/extrusion.  2. Degenerative disk disease of L3-4 and L5-S1.   PLAN:  Will refill the patient's hydrocodone.  Will give her hydrocodone  5/325, lowering her dose of acetaminophen.  She can take this three times a  day as needed.  She tells me there are days she does not need it over the  last week.  Will also add amitriptyline 10 mg at night to help her with  rest.  Will also give her some Lidoderm which she can use as needed.  Will  get her set up in physical therapy for education on proper body mechanics  and a spine stabilization program.  We reviewed the risks and benefits of  using hydrocodone.  There were no barriers to communication perceived.  She  will obtain notes from her previous injections, which she will bring to her  next visit.  We may consider S1 transforaminal epidural sternoid injection.  Will see which particular injections seemed to have helped her.  Apparently  the last two that she had done did help and the first one did not.     Brantley Stage, M.D.   DMK/MedQ  D:  09/13/2003 16:26:34  T:  09/13/2003 18:12:01  Job #:  [id number]   cc:   Hilda Lias, M.D.  [address]  [place], [place] [address]  Fax: [phone]

## 2010-10-09 NOTE — Op Note (Signed)
   NAME:  Carla Wagner, Carla Wagner NO.:  [id number]   MEDICAL RECORD NO.:  [id number]                   PATIENT TYPE:  INP   LOCATION:  9140                                 FACILITY:  [facility]   PHYSICIAN:  Janine Limbo, M.D.            DATE OF BIRTH:  [birth year]   DATE OF PROCEDURE:  05/25/2002  DATE OF DISCHARGE:                                 OPERATIVE REPORT   PREOPERATIVE DIAGNOSES:  1. Postpartum day zero.  2. Desires permanent sterilization.   POSTOPERATIVE DIAGNOSES:  1. Postpartum day zero.  2. Desires permanent sterilization.   PROCEDURE:  Postpartum tubal ligation.   SURGEON:  Janine Limbo, M.D.   ANESTHESIA:  Epidural.   INDICATIONS FOR PROCEDURE:  The patient is a 35 year old female, now para 3-  0-2-3, who had a vaginal delivery of a healthy female infant this morning.  She desires permanent sterilization.  She understands the indications for  her procedure and she accepts the risk of, but not limited to, anesthetic  complications, bleeding, infections, possible damage to surrounding organs,  and possible tubal failure (17/1000).   FINDINGS:  The patient has normal fallopian tubes bilaterally.   DESCRIPTION OF PROCEDURE:  The patient was taken to the operating room where  it was determined that the epidural she had for labor would be adequate for  tubal ligation.  The patient's abdomen was prepped with multiple layers of  Betadine and then sterilely draped. The subumbilical area was injected with  10 cc of 0.5% Marcaine with epinephrine.  A subumbilical incision was made  and carried sharply through the subcutaneous tissue, the fascia, and the  anterior peritoneum.  The left fallopian tube was identified and followed to  its fimbriated end.  The left fallopian tube then had a knuckle made using a  free tie and then a suture ligature of 0 plain catgut.  The knuckle of tube  thus made was sharply excised.  Hemostasis was adequate.  An  identical  procedure was carried out on the opposite side.  All instruments were then  removed.  The fascia and the anterior peritoneum were closed using a running  suture of 0 Vicryl.  The skin was reapproximated using subcuticular suture  of 4-0 Vicryl.  Sponge, needle and instrument counts were correct on two  occasions.  The estimated blood loss for the procedure was 10 cc.  The  patient tolerated the procedure well.  She was taken to the recovery room in  stable condition.                                               Janine Limbo, M.D.    AVS/MEDQ  D:  05/25/2002  T:  05/25/2002  Job:  [id number]

## 2010-10-09 NOTE — H&P (Signed)
NAME:  Carla Wagner, Carla Wagner NO.:  [id number]   MEDICAL RECORD NO.:  [id number]                   PATIENT TYPE:  INP   LOCATION:  9164                                 [facility]   PHYSICIAN:  Janine Limbo, M.D.            DATE OF BIRTH:  22-Nov-1975   DATE OF ADMISSION:  05/25/2002  DATE OF DISCHARGE:                                HISTORY & PHYSICAL   HISTORY OF PRESENT ILLNESS:  The patient is a 35 year old gravida 5, para 2-  0-2-2 at 40-2/7 weeks' who presents with spontaneous rupture of membranes at  approximately midnight with meconium stained fluid noted and uterine  contractions every five minutes.  She denied any headache, visual symptoms  or epigastric pain.  She was seen May 21, 2002, for NST secondary to  fetal tachycardia and PIH evaluation, normal PIH labs and BPP of 8/8 was  noted at that time.   Pregnancy has been remarkable for:  1) Third trimester elevated blood  pressure.  2) Desires bilateral tubal ligation with consent signed December 26, 2001.  3) Closely spaced pregnancy.  4) History of postpartum hypotension.  5) Smoker.  6) Two TABs.  7) History of laser surgery on the cervix.   PRENATAL LABORATORY DATA:  Blood type is O positive, Rh antibody negative.  VDRL nonreactive.  Rubella titer positive.  Hepatitis B surface antigen  negative.  Sickle cell test was negative.  Pap was normal.  Glucose  challenge was normal . AFP was normal.  Hemoglobin upon entering the  practice was 11.8.  It was 10.9 at 26 weeks.  Group B strep culture was  negative at 36 weeks.  EDC of May 23, 2002, was established by  ultrasound in the early second trimester at 14 weeks.   HISTORY OF PRESENT PREGNANCY:  The patient entered care at approximately 14  weeks.  She was noted to have a complete previa at that time although this  did resolve by 19 weeks.  She signed her tubal papers in August.  Fetal  fibronectin was done at 26 weeks  secondary to slight change in the cervix.  This was negative.  She had some occasional contractions.  She did have some  elevated blood pressures beginning at approximately 38-39 weeks.  These were  evaluated with [facility] labs.  She also had an ultrasound at 37 weeks that showed  normal growth and development.  She was seen on May 22, 2002, for a [facility]  evaluation and NST secondary to fetal tachycardia in the office.  These  findings were normal.   OBSTETRICAL HISTORY:  In 1990 and 1997 she had TABs.  In 1997, she also had  a vaginal birth of a female infant, weight 7 pounds 13 ounces, at 41 weeks.  She has questionable polyhydramnios.  She did have premature rupture of  membranes prior to onset of labor and meconium stained fluid.  In 2002, she  had a vaginal birth of a female infant, weight 8 pounds 9 ounces, at 39-6/7  weeks.  She had an eight hour labor.  She had epidural.  She did have  postpartum hypertension and meconium stained fluid.  She did have postpartum  blues after her first pregnancy.  She was also on Maxzide postpartum her  second pregnancy.   PAST MEDICAL HISTORY:  She was on Depo and condoms in the past.  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  She has a questionable history of HPV on Pap.  She reports the usual  childhood illnesses.  She has a history of constipation and lactose  intolerance.  Her only other hospitalization was for childbirth x2.   SOCIAL HISTORY:  She is a smoker of approximately one to two per day.  The  patient is single.  The father of the baby is involved and supportive.  The  patient is [place] and of the [place] faith.  She denies any  alcohol, drug use this pregnancy other than one to two cigarettes per day.  She has two years of college and is unemployed.  Her partner also has two  years of college.   SERVICE:  She has been followed by the physician's service at [facility].   ALLERGIES:  She has no known medication allergies.   FAMILY HISTORY:  Maternal grandfather had an MI.  Father had heart disease.  Mother had hypertension.  Paternal grandmother had varicosities.  Mother has  non-insulin-dependent diabetes.   GENETIC HISTORY:  Unremarkable.   PHYSICAL EXAMINATION:  VITAL SIGNS:  Blood pressure 130/86.  Other vital  signs are stable.  HEENT:  Within normal limits.  LUNGS:  Bilateral breath sounds are clear.  HEART:  Regular rate and rhythm without murmur.  BREASTS:  Soft and nontender.  ABDOMEN:  Fundal height is approximately 39 cm.  Estimated fetal weight is 8  to 8-1/2 pounds.  Uterine contractions are five minutes, 60 seconds, fresh  and moderately quality.  PELVIC:  Cervical exam 6 cm, slightly posterior, 80% vertex and -2 station  with white particulate meconium stain noted.  EXTREMITIES:  Deep tendon reflexes are 2+ without clonus.  There is a trace  edema.   IMPRESSION:  1. Intrauterine pregnancy at 40-2/7 weeks.  2. Active labor.  3. Desires epidural.  4. Desires tubal.   PLAN:  1. Admit per consult with Dr. Stefano Gaul.  2. Epidural anesthesia.  3. Routine physician's orders.  4. Plan postpartum tubal.  5. Close observation of blood pressure.     Renaldo Reel Emilee Hero, C.N.M.                   Janine Limbo, M.D.    VLL/MEDQ  D:  05/25/2002  T:  05/25/2002  Job:  [id number]

## 2010-10-09 NOTE — H&P (Signed)
NAMEERICIA, MOXLEY NO.:  [id number]   MEDICAL RECORD NO.:  [id number]          PATIENT TYPE:  OIB   LOCATION:  2899                         FACILITY:  [facility]   PHYSICIAN:  Hilda Lias, M.D.   DATE OF BIRTH:  [birth year]   DATE OF ADMISSION:  10/06/2004  DATE OF DISCHARGE:                                HISTORY & PHYSICAL   [name] is a lady who had been followed by me since October 2004  complaining of back pain __________ right leg associated with numbness and  weakness of the right foot.  Patient had had conservative treatment but  despite that she is not any better.  X-rays showed that she has a herniated  disk at L5-1 with displacement of the thecal sac and also degenerative  changes at the level 3-4/4-5.   PAST MEDICAL HISTORY:  Tubal ligation.   ALLERGIES:  She is not allergic to any medications.   SOCIAL HISTORY:  She drinks socially and she smokes half a pack of  cigarettes a day.   FAMILY HISTORY:  Mother is 77 with diabetes and lumbar surgery.   REVIEW OF SYSTEMS:  Positive for back pain and right leg pain.   PHYSICAL EXAMINATION:  GENERAL:  Patient came to my office limping from the  right leg.  HEENT:  Normal.  NECK:  Normal.  LUNGS:  Clear.  HEART:  Normal.  ABDOMEN:  Normal.  EXTREMITIES:  Normal pulse.  NEUROLOGIC:  She has weakening of the right foot mostly with plantar flexion  being about 3/5.  The straight leg raising is positive at 45 degrees.  Sensation she continues to __________ which involved mostly the S1 nerve  root.  The MRI showed that she has some degenerative disk disease at the  level 3-4, 4-5 but at the level 5-1 she has a large herniated disk which is  displacing the thecal sac.   CLINICAL IMPRESSION:  Right L5-S1 herniated disk.   RECOMMENDATIONS:  The patient is going to proceed with right 5-1 diskectomy  and foraminotomy.  She knows all of the risks such as infection, CSF leak,  __________ pain, paralysis,  need for further surgery __________.      EB/MEDQ  D:  10/06/2004  T:  10/06/2004  Job:  [id number]

## 2010-10-09 NOTE — Assessment & Plan Note (Signed)
DATE OF VISIT:  July 30, 2004.   MEDICAL RECORD NUMBER:  [id number].   DATE OF BIRTH:  [birth year].   [name] is a 35 year old black female who is a patient of Dr. Jeral Fruit.   She is back in today for recheck and refill of her medications.  She has  almost a year-history of symptoms of low back pain and right sciatic  symptoms.  She is back in for refill of her medications.  She reports her  average pain is about a 7 on a scale of 10.  It is constant, tingling and  aching into the right leg.  Sleep is fair.  Pain is worse with bending,  sitting and certain activities.  It improves with rest, heat, ice, therapy  and medication.  Relief from medications at this point is fairly good.  She  is able to walk about 15 minutes at a time.  She is able to climb stairs.  She drives.  She is currently not employed.   Denies bowel or bladder problems.  Denies suicidal ideation.  Denies  depression or anxiety.   Independent with all of her self-care.  Does need some assistance with  shopping and household duties.   No new changes in review of systems.  No new changes in past medical, social  or family history since last visit.   PHYSICAL EXAMINATION:  Blood pressure 125/47, pulse 74, respirations 16, 99%  saturated on room air.  [name] is a well-developed, well-nourished  woman.  No apparent distress during our interview today.  She is oriented x  3.  Affect is overall bright and alert.  She appears to have some antalgia,  but she seems to even out after a few stride lengths in the room.  She has  minimally decreased range of motion in lumbar forward flexion and extension  and lateral flexion.  Seated reflexes are 2+ at the knees and 1+ at the  ankles.  Sensory exam is intact.  She does have some increased sensitivity  in the right L5 dermatome.  There is no weakness noted in the EHL, evertors  or dorsiflexors.  She has full strength in her leg otherwise.  Appears to  have a mildly  positive straight leg raise, however.  No significant  tenderness with palpation over the lumbar paraspinal muscles.   IMPRESSION:  1.  Lumbago.  2.  Sciatica.  She feels that the sciatica is 90% of her pain complaint.   We have offered lumbar injections.  She has refused.  She had some initial  injections done at [facility] and has subsequently refused any further spinal  injections and has opted just for medical management.  However, she is  requesting to be followed by Dr. Jeral Fruit again.      DMK/MedQ  D:  07/30/2004 12:39:39  T:  07/30/2004 14:35:09  Job #:  [id number]

## 2010-10-09 NOTE — Assessment & Plan Note (Signed)
MEDICAL RECORD NUMBER:  [id number].   DATE OF BIRTH:  1976/03/04.   [name] is a 35 year old, single, black female who was referred by Dr.  Jeral Fruit for management of her back pain.   She has a history of degenerative disk disease at L3-4 and L4-5 and some  right S1 nerve compromise secondary to an L5 disk.  The last MRI was  approximately one year ago.   She has been attending physical therapy and has had some intermittent  improvements and exacerbations of her low back pain.  She recently still has  some back pain, as well as some right leg pain.  Average pain is about a 7  on a scale of 10.  She overall does remain function, but does get frustrated  with her pain level.   She is requesting refills today of her medications, the Norco, the Prilosec  and the Lidoderm.   She had been started on Neurontin.  She tells me that this was far too  sedating for her to take.   She is working on the bicycle and the treadmill in physical therapy.  She is  up to approximately walking a mile at a time on the treadmill, which is  about 15 minutes.  Denies any new problems with bowel and bladder, numbness,  tingling, weakness or gait changes.  Remains completely functional in all of  her ADLs.  Is up on her feet several  hours each day.  Sleep is overall  fair.  Denies any suicidal ideation.   Denies any change in her medical history.   PHYSICAL EXAMINATION:  She was lying on the plinth when I came in the room.  She is more comfortable this way.  She reports she has had some increased  low back pain and feels it has been secondary to her period coming on.   Once she sits up, she appears alert and in no acute distress, however.  The  blood pressure is 111/70, pulse 67 and 100% saturated on room air.  She is  oriented and cooperative.   Able to get off of the exam table.  Gait initially is somewhat slow.  Forward flexion is diminished.  Seated reflexes are 1+ at the knees and  ankles.   Toes are downgoing.  No clonus is noted.  She has excellent  strength in the lower extremities.  No sensory deficits are noted.  She has  possible mildly positive straight leg raise on the right.   IMPRESSION:  1.  Lumbago.  2.  Degenerative disk disease, especially at L3-4 and L4-5.  3.  Sciatica with a history of right S1 nerve root compromise secondary to      L5 disk.   PLAN:  Will refill medications, Norco 5/325 mg one p.o. t.i.d., Lidoderm 5%  #90 and Prilosec 20 mg daily.  Will switch her from Neurontin to Topamax 25  mg daily for seven days and then 25 mg b.i.d. for the rest of the month.   She will let us know if she has any problems with these medications.   At the next visit, she will let me know what percent of he pain is in her  leg versus her back.  May reconsider MRI since it has been about a year and  she is still having some right leg pain.  May consider elective epidural  sternoid injection in the right S1 nerve root.  Will discuss this with her  further at the  next visit.  Will see her back in a month.       DMK/MedQ  D:  03/26/2004 12:22:25  T:  03/26/2004 17:17:57  Job #:  [id number]

## 2010-10-09 NOTE — H&P (Signed)
NAME:  Carla Wagner, Carla Wagner                        ACCOUNT NO.:  [id number]   MEDICAL RECORD NO.:  [id number]                   PATIENT TYPE:  INP   LOCATION:  9304                                 [facility]   PHYSICIAN:  Naima A. Dillard, M.D.              DATE OF BIRTH:  06-16-1975   DATE OF ADMISSION:  05/30/2002  DATE OF DISCHARGE:                                HISTORY & PHYSICAL   HISTORY OF PRESENT ILLNESS:  The patient is a 35 year old gravida 5, para 3-  0-2-2 who is status post vaginal delivery and tubal ligation on May 25, 2002.  She presented to maternity admissions today with complaint of chest  pain worsening over the last few days.  The patient noticed the chest pain  after she had her tubal ligation, but thought it was gas pain.  However, in  the last couple days it has not gone away.  It does radiate from the front  to the back.  Nothing makes it better.  She denies having any nausea,  vomiting, or diaphoresis.  She denies having any reflux.  Denies any muscle  aches.  She states that she is unable to lie down at night.  She is starting  to get chest pain.  The patient does smoke cigarettes, or did smoke  cigarettes like one to two at pregnancy, but quit right after delivery.  Her  pregnancy has been complicated by pregnancy induced hypertension.  Her blood  pressures were labile.  She had a 24-hour urine which was normal.  Never  found to be preeclamptic.  She had postpartum hypertension with her last  pregnancy and was treated with Maxzide so maybe has a type of chronic  hypertension.  Had headaches with this pregnancy starting since 18 weeks.  The patient today denies any headache.  The patient had a headache earlier,  though.  Denied any blurred vision or right upper quadrant pain.   PRENATAL LABORATORIES:  She is O+.  Rh antibody negative.  RPR is  nonreactive.  Her rubella is positive.  Hepatitis B surface antigen was  negative.  Sickle cell test was  negative.  Pap was normal.  Her glucose  challenge was normal.  AFP was normal.  Hemoglobin entering practice was  10.9.  At 26 weeks group B Strep culture was negative.   OBSTETRICAL HISTORY:  In 1990 and 1997 she had therapeutic abortions.  In  1997 she also had a vaginal birth of a female weighing 7 pounds 13 ounces at  41 weeks and had questionable polyhydramnios.  She did have premature  rupture of membranes prior to the onset of labor.  In 2002 she had vaginal  birth of a female infant weighing 8 pounds 9 ounces.   PAST MEDICAL HISTORY:  She has used Depo Provera in the past.  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  Follow-up Paps have been  normal.  She had questionable HPV in the past.   SOCIAL HISTORY:  She is a smoker.  She smokes usually one to two cigarettes  per day.  She did stop smoking right after baby delivered.   ALLERGIES:  The patient has no known drug allergies.   FAMILY HISTORY:  Significant for maternal grandfather who had myocardial  infarction.  Her father has heart disease.  Her mother has hypertension.  Her grandmother has varicosities.  Mother also has non-insulin-dependent  diabetes.   GENETIC HISTORY:  Unremarkable.   REVIEW OF SYSTEMS:  ENDOCRINE:  Negative.  CARDIOVASCULAR:  As above.  GENITOURINARY:  As above.  PSYCHIATRIC:  Unremarkable.   PHYSICAL EXAMINATION:  VITAL SIGNS:  Blood pressure 150/100, 151/93, pulse  89, respiratory rate 20, pulse oximetry 99-100% on room air while sitting  up.  GENERAL:  She is in no apparent distress while sitting.  HEART:  Regular.  LUNGS:  Clear to auscultation bilaterally.  CHEST:  No chest wall tenderness.  ABDOMEN:  Nondistended, soft with good bowel sounds.  Incision has mild  serous drainage, is intact.  Fundus is firm below the umbilicus.  GENITOURINARY:  Minimal vaginal bleeding.  EXTREMITIES:  No cyanosis or clubbing with mild edema.   LABORATORIES:  UA is negative for protein.   Hemoglobin 7.1, platelets  259,000.  Hemoglobin before discharge on the third of January was 6.7.  BUN/creatinine are 9 and 0.8.  AST 12, ALT less than 19.  Her chest x-ray  did show prominent heart size with mild vascular changes and small pleural  effusion.  Her EKG is normal sinus rhythm.  Her cardiac enzymes are normal,  CK being 55, CK-MB 0.5.  Uric acid was 5.9.   ASSESSMENT:  1. Postpartum hypertension.  2. Chest x-ray consistent with pulmonary congestion.  3. Chest pain.  The patient will be given Lasix.  She was given morphine for     chest pain and a gastrointestinal cocktail which did not help so she will     be given Dilaudid.  Because it is a narcotic she has chosen to pump and     dump.  She can sit in a upright position due to orthopnea.  We will     replace her potassium with K-Dur.  I will give him p.o. labetalol to help     with her blood pressure.  I spoke to Cassell Clement, M.D. who agreed     with the plan and cardiology will see her.  We will get echocardiogram in     the morning.  I will also get amylase, lipase because she said the pain     sometimes radiates to the back, just to rule out atypical __________     pancreatitis.  Also will check a thyroid.                                               Naima A. Normand Sloop, M.D.    NAD/MEDQ  D:  05/30/2002  T:  05/30/2002  Job:  [id number]

## 2010-10-09 NOTE — Assessment & Plan Note (Signed)
REFERRING PHYSICIAN:  Hilda Lias, M.D.   [name] is a 35 year old referred by Dr. Jeral Fruit for evaluation and  treatment of low back pain.  She is back in since her second visit here.  Initially she was seen and started on some hydrocodone, Lidoderm and Elavil  and since physical therapy, she reports she has done very well in physical  therapy and feels it has helped her quite a bit.  She has been sent for  education as well as a spine stabilization program.  Overall, she is doing  quite well.  Her average pain score is about a 4 on a scale of 10, worst the  last 24 hours at about a 5 on a scale of 10.  She takes the hydrocodone  about two times every other day.  Some days she does not need it at all.  About three or four days a week she does not use it all.   At the last visit, we were considering doing an S1 transforaminal injection.  At this point, she reports she is doing so well, she really is not  interested in any further invasive type pain management options.   She denies any bowel or bladder problems, denies any numbness, tingling or  weakness.  Pain is mainly located in the low back area, radiates slightly  down the posterior hip area to the posterior thigh area, ending well above  the knee.   PHYSICAL EXAMINATION:  GENERAL APPEARANCE:  She is a well-developed, well-  nourished woman who does not appear in any distress during our interview.  VITAL SIGNS:  She has blood pressure of 123/65, pulse 84, and 100% saturated  on room air.  NEUROLOGIC:  She is able to get off the examination table easily.  Her gait  is a little slow initially and she is able to even it out and her gait is  normal as she walks in the room.  She has quite limited range of motion in  her lumbar spine, limited in all planes, especially flexion.  She has about  20 degrees of forward flexion.  Extension has about 15 degrees.  Lateral  flexion is about 10 degrees bilaterally.  Seated, her reflexes are  0 at the  knees and ankles.  Toes are downgoing, no clonus is noted.  She has no  deficits to light touch or pinprick in lower extremities.  She has 5/5  strength at the hip flexors, knee extensors, dorsiflexors, plantar flexors  and EHL. Straight leg raising is negative.   IMPRESSION:  Degenerative disc disease at L3-4 and L4-5 with some right S1  nerve root compromise secondary to L5-S1 disc; however, overall improvement  in pain scores.   PLAN:  Following prescriptions were written for [name] today:  1. Naprosyn 500 mg one p.o. b.i.d. take with food, #30.  2. Prilosec 20 mg  one p.o. daily, #30.  3. Lidoderm 5% one to three patches 12 hours on and 12 hours off, one box.  4. Norco 5/325 one p.o. t.i.d. p.r.n. #90.  5. Amitriptyline 10 mg one p.o. q.h.s. p.r.n. #30.   Will see her back in one month.  Will have her finish up the therapy program  Oct 22, 2003      Brantley Stage, M.D.   DMK/MedQ  D:  10/23/2003 14:10:08  T:  10/23/2003 15:23:58  Job #:  [id number]   cc:   Hilda Lias, M.D.  [address]  [place], [place] [address]  Fax: [phone]

## 2010-10-09 NOTE — Assessment & Plan Note (Signed)
[name] is a 35 year old single who has been seen [facility] for low-back pain secondary to degenerative disk  disease at L3-4, L4-5 and some right D1 root compromise secondary to a L5  vessel and disk.  Her pain has typically been about a six on a 10, goes up  to a seven and lasts 24 hours, down to a two in the last 24 hours.   The patient denies any new problems with bowel or bladder, weakness or gait  changes.  She occasionally gets some burning in her feet or some tingling in  her feet.   Functionally, she is high-level.  She is able to feed, dress, bathe, oral  and facial hygiene, ___________ independent.  She is able to walk about 10-  15 minutes.  She does not engage in any kind of regular exercise at this  time.  She is able to drive independently.  She is currently not employed.  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  Her sleep  is fair.  Denies suicidal ideation.  Denies any change in her medical  history.   PHYSICAL EXAMINATION:  GENERAL APPEARANCE:  On exam she is alert, oriented,  cooperative and pleasant.  No pain behavior is noted.  VITAL SIGNS:  Blood pressure is 108/66, pulse 70, respirations 18 and 100%  saturation on room air.  NEUROLOGIC:  The patient is able to get up out the chair easily.  Gait in  the room is normal. Forward flexion and extension do not significantly  aggravate her back pain.  She does report some end-range pain, however.  Seated reflexes are 1+ at the knees and 0 at the ankles.  Toes are  downgoing.  No clonus is noted.  Motor strength is 5/5 at hip flexors, knee  extensors, dorsiflexors, plantar flexors, EHL, and everters and knee  flexors.  Coordination is grossly intact.  Sensation is intact in the lower  extremities.  Gait is normal.   IMPRESSION:  1.  Lumbago.  2.  Degenerative disk disease, lumbar 3-4, lumbar 4-5.  3.  Right sacral-1 nerve root compromise secondary to lumbar-5,  sacral-1      disk, intermittent sciatic type symptoms.   PLAN:  1.  We refill the following medications:      1.  Norco 5/325 1 p.o. t.i.d. #90.      2.  Elavil 10 mg 1 p.o. q.h.s., #30.      3.  Lidoderm 5% 1-3 patches as directed, two hours on, two hours off,          #90.      4.  Prilosec 20 mg 1 p.o. daily, #30.  2.  We will see her back in one month.  She has been quite stable.      Brantley Stage, M.D.   DMK/MedQ  D:  01/31/2004 12:15:01  T:  02/02/2004 00:35:50  Job #:  [id number]

## 2010-10-09 NOTE — Op Note (Signed)
NAMEMARONDA, CAISON NO.:  [id number]   MEDICAL RECORD NO.:  [id number]          PATIENT TYPE:  OIB   LOCATION:  2899                         FACILITY:  [facility]   PHYSICIAN:  Hilda Lias, M.D.   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   PROCEDURE:  Right L5-S1 discectomy, lysis of adhesions, decompression of L5-  S1 nerve root, microscope.   SURGEON:  Hilda Lias, M.D.   ASSISTANT:  Coletta Memos, M.D.   CLINICAL HISTORY:  Mrs. Snipes had been complaining of back pain and right  leg pain for almost two years.  By x-ray, she has a large herniated disc at  L5-S1 with distress on thecal sac.  She wanted to avoid surgery.  Right now,  she is to the point that the pain is getting 24 hours a day, associated with  weakness.  Surgery was advised and the risk was explained at the history and  physical.   DESCRIPTION OF PROCEDURE:  The patient was taken to the OR and she was  positioned in prone manner.  Because of her size, it was difficult to feel  the spinous process.  Nevertheless, we made a made incision and we were  straight down to the L5-S1 area.  X-ray showed that indeed we were at the  level of L5-S1.  We brought the microscope immediately and with drill, we  drilled the lower lamina of L5 and the upper part of S1.  The yellow  ligament was also excised. We were unable to retract the thecal sac.  There  was quite a bit of adhesion up to the point it was difficult to see where  was the dural matter and where the disc was located.  At the end,we were  able to retract the S1 nerve root and we worked our way lateral to medial.  We entered the disc space and a large amount of degenerative disc which  was  mixed, calcified with adhesion, scar tissue and soft tissue were removed.  Care was taken not to enter the subarachnoid space.  Finally at the end, we  were able to retract the thecal sac.  There was a midline osteophyte which  was also removed.  Good decompression of the L5-S1 nerve root was achieved.  Although there was no evidence of any CSF leak, because of the retraction we  did and the amount of adhesion we found, we used Tisseel for the dural  space.  Fentanyl was also used.  Hemostasis was done with bipolar and the  wound was closed with Vicryl and Steri-Strips.      EB/MEDQ  D:  10/06/2004  T:  10/06/2004  Job:  [id number]

## 2011-09-16 IMAGING — RF DG CHOLANGIOGRAM OPERATIVE
1 series · 4 of 4 positions shown · non-contrast
Comparison: Ultrasound 04/15/2009

CLINICAL DATA: Cholecystitis.

INTRAOPERATIVE CHOLANGIOGRAM

[Series 1: run · 4 of 75 frames shown]
[frame 12/75]
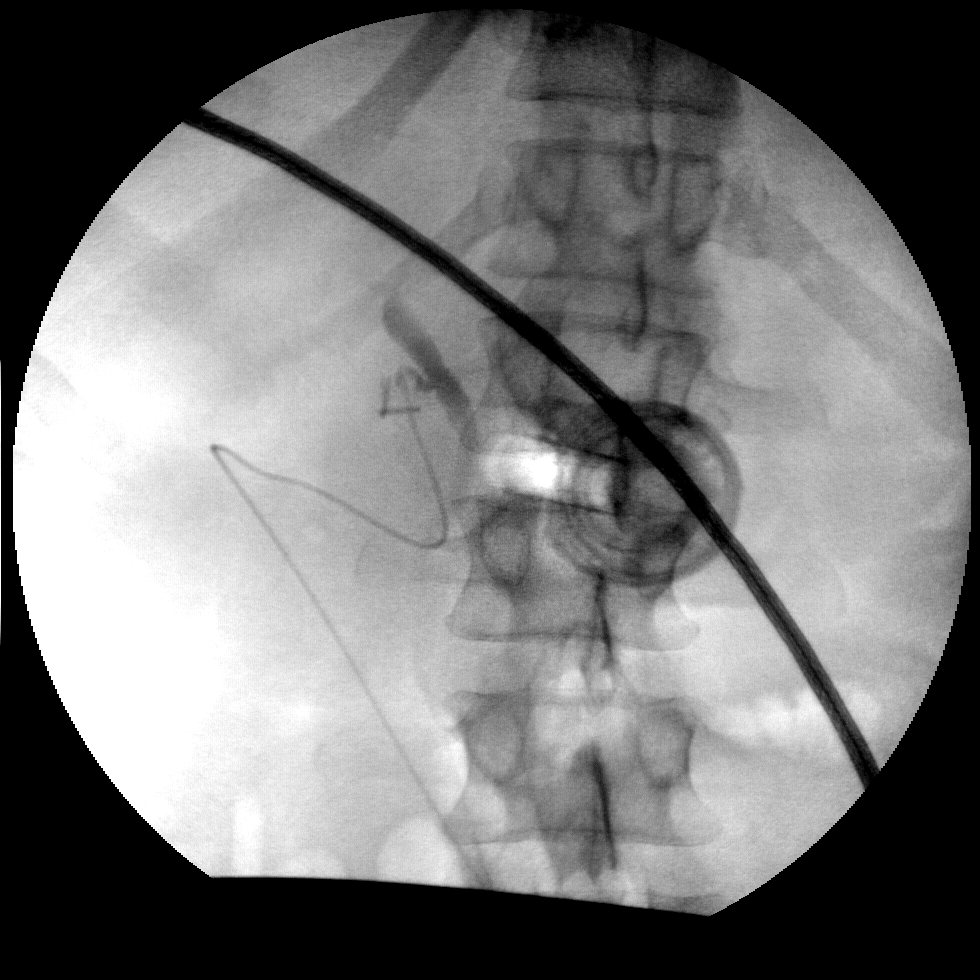
[frame 38/75]
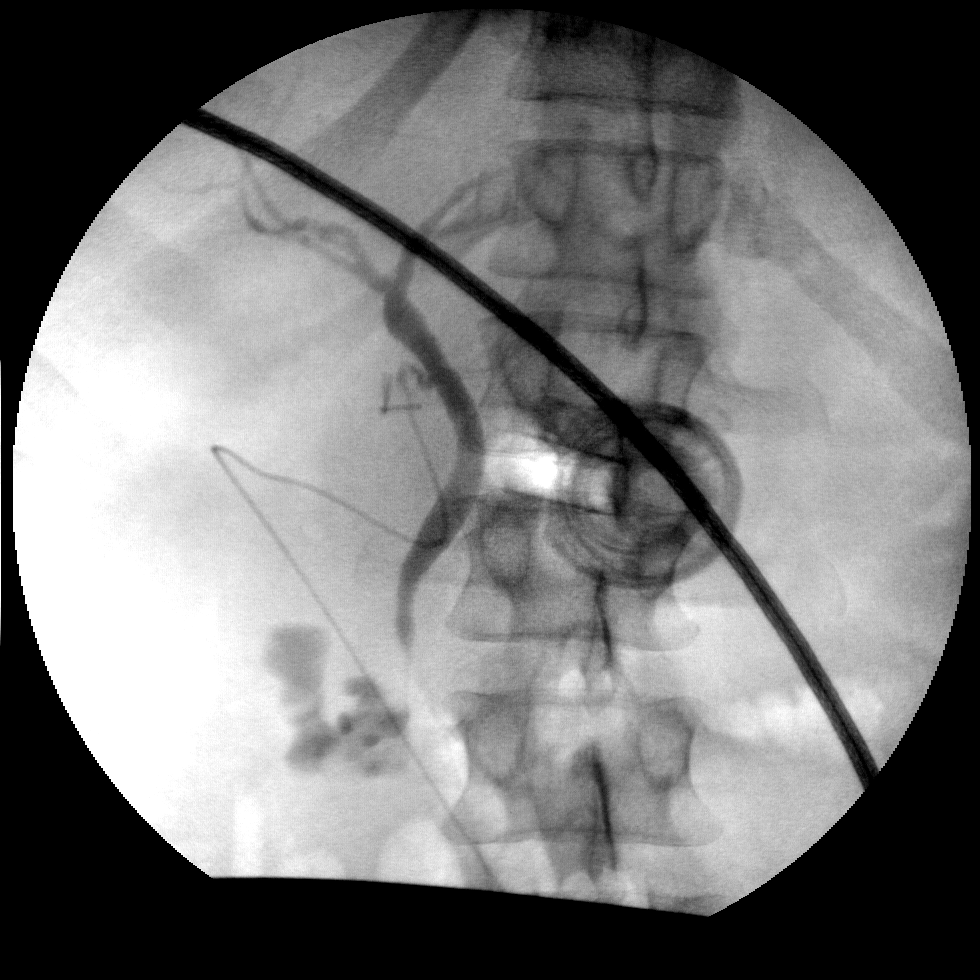
[frame 64/75]
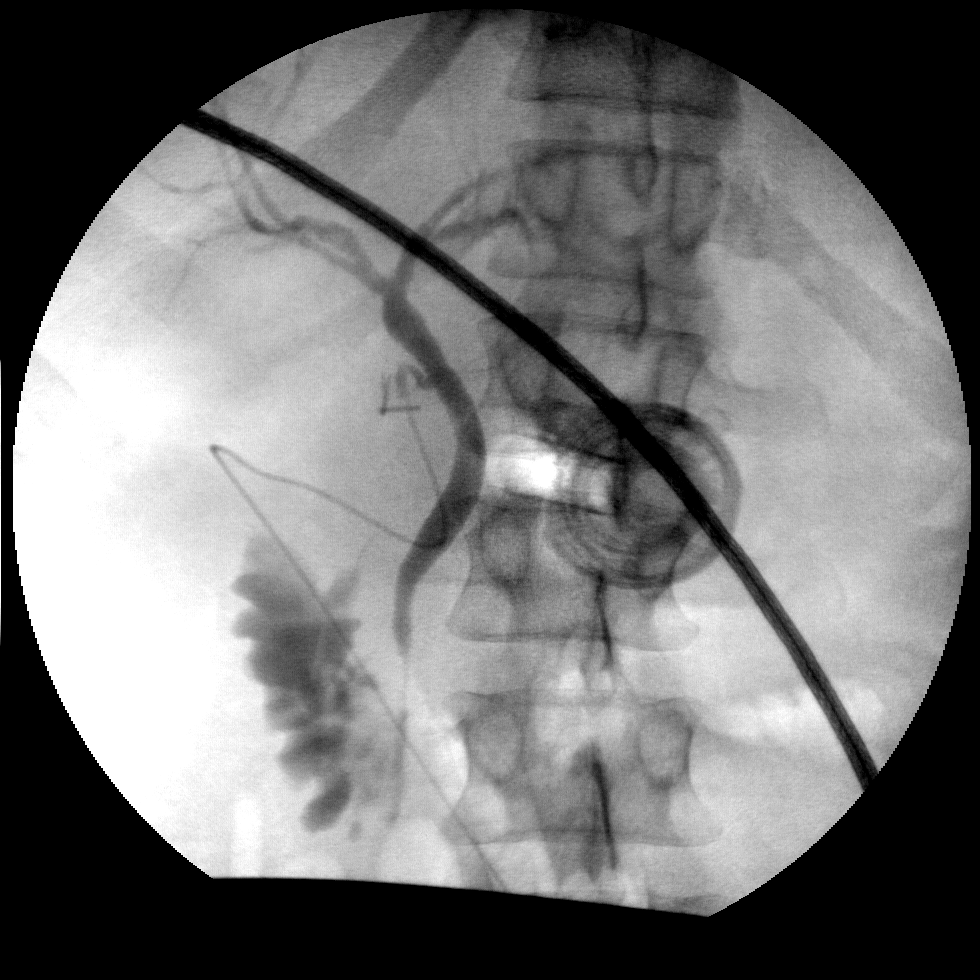
[frame 74/75]
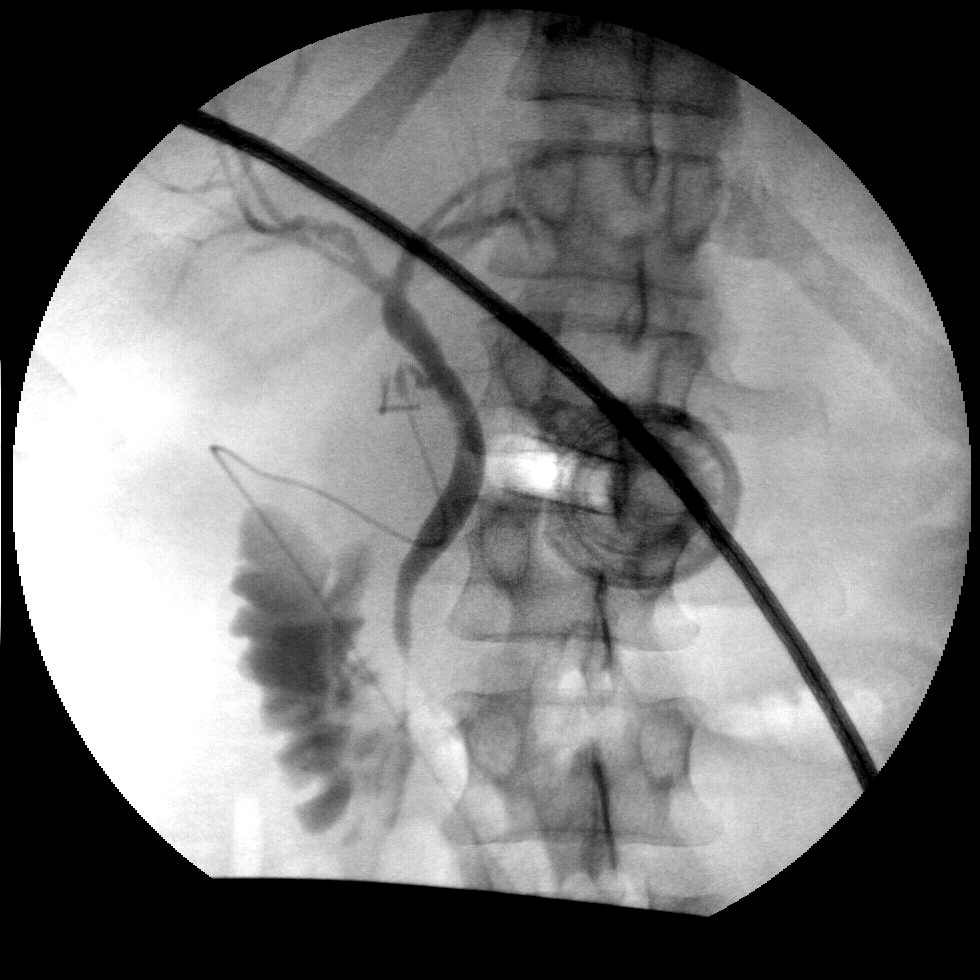

[4 of 4 positions shown; findings below may reference images not displayed]

FINDINGS: Intraoperative spot images show normal caliber biliary
system.  No evidence of retained stone or obstruction.  Free
passage of contrast into the small bowel noted.
IMPRESSION: Unremarkable study.

These images were submitted for radiologic interpretation only.
Please see the procedural report for the amount of contrast and the
fluoroscopy time utilized.

## 2013-02-12 ENCOUNTER — Other Ambulatory Visit: Payer: Self-pay | Admitting: Family Medicine

## 2013-02-12 ENCOUNTER — Ambulatory Visit
Admission: RE | Admit: 2013-02-12 | Discharge: 2013-02-12 | Disposition: A | Discharge status: Home / Self Care | Payer: 59 | Source: Ambulatory Visit | Attending: Family Medicine | Admitting: Family Medicine

## 2013-02-12 DIAGNOSIS — M545 Low back pain: Secondary | ICD-10-CM

## 2014-05-01 ENCOUNTER — Encounter (HOSPITAL_COMMUNITY): Payer: Self-pay | Admitting: Emergency Medicine

## 2014-05-01 ENCOUNTER — Emergency Department (HOSPITAL_COMMUNITY)
Admission: EM | Admit: 2014-05-01 | Discharge: 2014-05-01 | Disposition: A | Discharge status: Home / Self Care | Payer: 59 | Attending: Emergency Medicine | Admitting: Emergency Medicine

## 2014-05-01 DIAGNOSIS — G4700 Insomnia, unspecified: Secondary | ICD-10-CM | POA: Insufficient documentation

## 2014-05-01 DIAGNOSIS — Z79899 Other long term (current) drug therapy: Secondary | ICD-10-CM | POA: Diagnosis not present

## 2014-05-01 DIAGNOSIS — F419 Anxiety disorder, unspecified: Secondary | ICD-10-CM | POA: Diagnosis present

## 2014-05-01 DIAGNOSIS — F410 Panic disorder [episodic paroxysmal anxiety] without agoraphobia: Secondary | ICD-10-CM | POA: Diagnosis not present

## 2014-05-01 DIAGNOSIS — Z720 Tobacco use: Secondary | ICD-10-CM | POA: Diagnosis not present

## 2014-05-01 HISTORY — DX: Anxiety disorder, unspecified: F41.9

## 2014-05-01 MED ORDER — LORAZEPAM 1 MG PO TABS: 0.5000 mg | ORAL_TABLET | Freq: Three times a day (TID) | ORAL | Status: DC | PRN

## 2014-05-01 MED ORDER — LORAZEPAM 0.5 MG PO TABS
0.5000 mg | ORAL_TABLET | Freq: Once | ORAL | Status: AC
  Administered 2014-05-01: 0.5 mg via ORAL
  Filled 2014-05-01: qty 1

## 2014-05-01 NOTE — ED Notes (Addendum)
Pt states she has been having panic attacks last one around 0830 this morning. Pt took medication that put here to sleep but states he woke up anxious. Pt also c/o nausea but no vomiting, lightheadedness as well. Pt also states when she had has panic attacks she gets coughing spells.
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
## 2014-05-01 NOTE — ED Notes (Signed)
Hannah PA at bedside

## 2014-05-01 NOTE — Discharge Instructions (Signed)
1. Medications: Ativan (0.25mg  up to 1mg ) as needed for anxiety, usual home medications 2. Treatment: rest, drink plenty of fluids, exercise regularly, decrease caffeine intake, stop smoking, increase water intake, eat healthy diet, walking and breathing techniques as discussed 3. Follow Up: Please followup with your primary doctor as needed for further evaluation for discussion of your diagnoses and further evaluation after today's visit; if you do not have a primary care doctor use the resource guide provided to find one; please use the resource guide to find a psychiatrist for further evaluation and treatment within 3 days, Please return to the ER for worsening anxiety attacks or other concerns    Panic Attacks Panic attacks are sudden, short-livedsurges of severe anxiety, fear, or discomfort. They may occur for no reason when you are relaxed, when you are anxious, or when you are sleeping. Panic attacks may occur for a number of reasons:   Healthy people occasionally have panic attacks in extreme, life-threatening situations, such as war or natural disasters. Normal anxiety is a protective mechanism of the body that helps us react to danger (fight or flight response).  Panic attacks are often seen with anxiety disorders, such as panic disorder, social anxiety disorder, generalized anxiety disorder, and phobias. Anxiety disorders cause excessive or uncontrollable anxiety. They may interfere with your relationships or other life activities.  Panic attacks are sometimes seen with other mental illnesses, such as depression and posttraumatic stress disorder.  Certain medical conditions, prescription medicines, and drugs of abuse can cause panic attacks. SYMPTOMS  Panic attacks start suddenly, peak within 20 minutes, and are accompanied by four or more of the following symptoms:  Pounding heart or fast heart rate (palpitations).  Sweating.  Trembling or shaking.  Shortness of breath or  feeling smothered.  Feeling choked.  Chest pain or discomfort.  Nausea or strange feeling in your stomach.  Dizziness, light-headedness, or feeling like you will faint.  Chills or hot flushes.  Numbness or tingling in your lips or hands and feet.  Feeling that things are not real or feeling that you are not yourself.  Fear of losing control or going crazy.  Fear of dying. Some of these symptoms can mimic serious medical conditions. For example, you may think you are having a heart attack. Although panic attacks can be very scary, they are not life threatening. DIAGNOSIS  Panic attacks are diagnosed through an assessment by your health care provider. Your health care provider will ask questions about your symptoms, such as where and when they occurred. Your health care provider will also ask about your medical history and use of alcohol and drugs, including prescription medicines. Your health care provider may order blood tests or other studies to rule out a serious medical condition. Your health care provider may refer you to a mental health professional for further evaluation. TREATMENT   Most healthy people who have one or two panic attacks in an extreme, life-threatening situation will not require treatment.  The treatment for panic attacks associated with anxiety disorders or other mental illness typically involves counseling with a mental health professional, medicine, or a combination of both. Your health care provider will help determine what treatment is best for you.  Panic attacks due to physical illness usually go away with treatment of the illness. If prescription medicine is causing panic attacks, talk with your health care provider about stopping the medicine, decreasing the dose, or substituting another medicine.  Panic attacks due to alcohol or drug abuse go away  with abstinence. Some adults need professional help in order to stop drinking or using drugs. HOME CARE  INSTRUCTIONS   Take all medicines as directed by your health care provider.   Schedule and attend follow-up visits as directed by your health care provider. It is important to keep all your appointments. SEEK MEDICAL CARE IF:  You are not able to take your medicines as prescribed.  Your symptoms do not improve or get worse. SEEK IMMEDIATE MEDICAL CARE IF:   You experience panic attack symptoms that are different than your usual symptoms.  You have serious thoughts about hurting yourself or others.  You are taking medicine for panic attacks and have a serious side effect. MAKE SURE YOU:  Understand these instructions.  Will watch your condition.  Will get help right away if you are not doing well or get worse. Document Released: 05/10/2005 Document Revised: 05/15/2013 Document Reviewed: 12/22/2012 [facility] Patient Information 2015 [place], [place]. This information is not intended to replace advice given to you by your health care provider. Make sure you discuss any questions you have with your health care provider.    Emergency Department Resource Guide 1) Find a Doctor and Pay Out of Pocket Although you won't have to find out who is covered by your insurance plan, it is a good idea to ask around and get recommendations. You will then need to call the office and see if the doctor you have chosen will accept you as a new patient and what types of options they offer for patients who are self-pay. Some doctors offer discounts or will set up payment plans for their patients who do not have insurance, but you will need to ask so you aren't surprised when you get to your appointment.  2) Contact Your Local Health Department Not all health departments have doctors that can see patients for sick visits, but many do, so it is worth a call to see if yours does. If you don't know where your local health department is, you can check in your phone book. The CDC also has a tool to help you locate  your state's health department, and many state websites also have listings of all of their local health departments.  3) Find a Walk-in Clinic If your illness is not likely to be very severe or complicated, you may want to try a walk in clinic. These are popping up all over the country in pharmacies, drugstores, and shopping centers. They're usually staffed by nurse practitioners or physician assistants that have been trained to treat common illnesses and complaints. They're usually fairly quick and inexpensive. However, if you have serious medical issues or chronic medical problems, these are probably not your best option.  No Primary Care Doctor: - Call Health Connect at  [phone] - they can help you locate a primary care doctor that  accepts your insurance, provides certain services, etc. - Physician Referral Service- [phone]  Chronic Pain Problems: Organization         Address  Phone   Notes  [facility]  [phone] Patients need to be referred by their primary care doctor.   Medication Assistance: Organization         Address  Phone   Notes  [facility] Medication [facility] [address] [place] [place], [place] [address] [phone] --Must be a resident of [facility] -- Must have NO insurance coverage whatsoever (no Medicaid/ Medicare, etc.) -- The pt. MUST have a primary  care doctor that directs their care regularly and follows them in the community   [facility]ist  [phone]   [employer]  [phone]    Agencies that provide inexpensive medical care: Organization         Address  Phone   Notes  [facility]  [phone]   [facility]    [phone]   [facility] [address] [place], [place] [address] [phone]   [facility] [address] [place], [place] [phone]   Planned Parenthood    [phone]   [facility]     [phone]   [facility]  [address], [place] Phone:  [phone], Fax:  [phone] Hours of Operation:  9 am - 6 pm, M-F.  Also accepts Medicaid/Medicare and self-pay.  [facility]  [address], [place] Phone: [phone], Fax: [phone]. Hours of Operation:  8:30 am - 5:30 pm, M-F.  Also accepts Medicaid and self-pay.  [facility] [place] [address] [place] Phone: [phone]   [facility] [address] Natasha Bence [place], [place] [phone], Ext. [phone] Mondays & Thursdays: 7-9 AM.  First 15 patients are seen on a first come, first serve basis.    Medicaid-accepting [facility] Providers:  Organization         Address  Phone   Notes  [facility] [address], [place] [phone] Also accepts self-pay patients.  [facility] [address] Laurell Josephs [place], [place]  [phone]   [facility] [address], [place] [phone]   [facility] [address] [place] [phone]   Renaye Rakers [address], [place]   [phone] Only accepts [place] Access [place] patients after they have their name applied to their card.   Self-Pay (no insurance) in [facility]:  Organization         Address  Phone   Notes  Sickle Cell Patients, [facility] [address] [place], [place] [phone]   [facility] [address] [place], [place] [phone]   [facility] [place]  [address], [place] [phone]   [facility]/Dr. Osei-Bonsu  [address], [place] or [address], [place] [phone] Phone number for both [place] and [place] locations is the same.  [facility] [address] [place] [phone]   [facility] [address], [place] or [address] [phone]   [facility] [address] [place] [phone], phone; [phone], fax Sees patients 1st and 3rd Saturday of every month.  Must not qualify for public or private insurance (i.e. Medicaid, Medicare, [place] Health Choice, Veterans' Benefits)  Household income should be no more than 200% of the poverty level The clinic cannot treat you if you are pregnant or think you are pregnant  Sexually transmitted diseases are not treated at the clinic.    Dental Care: Organization         Address  Phone  Notes  [facility] Department of [facility] [address] [place], [place] [phone]  Accepts children up to age 40 who are enrolled in Medicaid or [place] Health Choice; pregnant women with a Medicaid card; and children who have applied for Medicaid or [place] Health Choice, but were declined, whose parents can pay a reduced fee at time of service.  [facility] [place]  [address], [place] [phone] Accepts children up to age 40 who are enrolled in [place] or [place] Health Choice; pregnant women with a Medicaid card; and children who have applied for Medicaid or [place] Health Choice, but were declined, whose parents can pay a reduced fee at time of service.  [facility]  [address], [place] [phone] Patients are seen by appointment only. Walk-ins are not accepted. [facility] will see patients 40 years of age and older. Monday - Tuesday (8am-5pm) Most Wednesdays (8:30-5pm) $30 per visit, cash only  [facility]  [address], [facility] [place] [place] [phone] Patients are seen by appointment only. Walk-ins are not accepted. [facility] will see patients 40 years of age and older. One Wednesday Evening (Monthly: Volunteer  Based).  $30 per visit, cash only  [employer] [facility]  [phone] for adults; Children under age 374, call Graduate Pediatric Dentistry at [phone]. Children aged 194-14, please call [phone] to request a pediatric application.  Dental services are provided in all areas of dental care including fillings, crowns and bridges, complete and partial dentures, implants, gum treatment, root canals, and extractions. Preventive care is also provided. Treatment is provided to both adults and children. Patients are selected via a lottery and there is often a waiting list.   [facility] [address], [place]  [phone] [email or web address]   [facility] [address], [place], [place] [phone], Ext. [phone] Second and Fourth Thursday of each month, opens at 6:30 AM; Clinic ends at 9 AM.  Patients are seen on a first-come first-served basis, and a limited number are seen during each clinic.   [facility]  [address], [place], [place] [phone]   Eligibility Requirements You must have lived in [place], [place], or [place] counties for at least the last three months.   You cannot be eligible for state or federal sponsored [place] Cityhealthcare insurance, including CIGNAVeterans Administration, [place]icaid, or [employer]icare.   You generally cannot be eligible for healthcare insurance through your employer.    How to apply: Eligibility screenings are held every Tuesday and Wednesday afternoon from 1:00 pm until 4:00 pm. You do not need an appointment for the interview!  [facility] [address], [place], [place] [phone]   [facility]  [phone]   [facility]  [phone]   [facility]  [phone]    Behavioral Health Resources in the Community: Intensive Outpatient Programs Organization         Address  Phone  Notes  [facility]  [facility] [address], [place], [place] [phone]   [facility] [address], [place], [place] [phone]   [facility] [address], [place], [place]  [phone]   [facility] [address],  [place], [place] [phone] or [phone]   Substance Abuse Resources Organization         Address  Phone  Notes  [facility]  [phone]  [facility]  [phone]   [facility] [place]  [phone]   Floydene Flock  [phone]   Residential & Outpatient Substance Abuse Program  [phone]   Psychological Services Organization         Address  Phone  Notes  [facility]  [phone]   [facility]  [phone]   [facility] [address], [place] [phone] or [phone]    Mobile Crisis Teams Organization         Address  Phone  Notes  [facility], Mobile Crisis Care Unit  [phone]   [facility]  [address] [place], [place] [phone]   Doristine Locks [address] [place] [phone]    Self-Help/Support Groups Organization         Address  Phone             Notes  [facility] [place] - variety of support groups  [phone] Call for more information  Narcotics Anonymous (NA), [facility] [address], [employer] [place]  2 meetings at this location   Statistician         Address  Phone  Notes  [facility] [address],    [place]  [phone]   [facility]  [address], [place] [address], [place], [place] [phone]   [facility] [address] [place], [place] [phone] Admissions: 8am-3pm M-F  [facility] [address]    [place], [place] [phone]   [facility] [address] [place], [place], [place] [phone]    [facility] [address],  [place], [place] [phone]   [facility] - Intensive Outpatient [address] Laurell Joseph[address], [place], [place] [phone]   [facility] ([facility] [address] [place].,  [place], [place] [phone] or [phone]   [facility] ([facility]) [address] [place], [place] [phone] Accepts Medicaid  [facility] [address]  [place] [phone] Substance Abuse/Addiction Treatment   [facility] Organization         Address  Phone  Notes  [facility]  [phone]   Angie Fava, PhD [address] Ervin Knack [place], [place]   [phone] or [phone]   [facility]   [address] [place], [place] [phone]   [facility] [address] [place], [place] [phone] Insurance/Medicaid/sponsorship through [facility] and Families [address]                                    [place], [place] [phone] Therapy/tele-psych/case  [facility] [address], [place] [phone]    Dr. Lolly Mustache  [phone]   [facility]  [employer] [facility] Dept. 1) [address], [place] 2) [address] [place] [address], [place] 3)  [address], [place] [phone]  [phone]   [facility] Child Abuse Hotline 724-327-4335 or [phone] (After Hours)

## 2014-05-08 ENCOUNTER — Encounter (HOSPITAL_COMMUNITY): Payer: Self-pay | Admitting: Psychiatry

## 2014-05-08 ENCOUNTER — Other Ambulatory Visit (HOSPITAL_COMMUNITY): Payer: PRIVATE HEALTH INSURANCE | Admitting: Psychiatry

## 2014-05-08 DIAGNOSIS — G4700 Insomnia, unspecified: Secondary | ICD-10-CM | POA: Insufficient documentation

## 2014-05-08 DIAGNOSIS — F411 Generalized anxiety disorder: Secondary | ICD-10-CM

## 2014-05-08 DIAGNOSIS — F331 Major depressive disorder, recurrent, moderate: Secondary | ICD-10-CM

## 2014-05-08 DIAGNOSIS — F410 Panic disorder [episodic paroxysmal anxiety] without agoraphobia: Secondary | ICD-10-CM | POA: Diagnosis not present
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
## 2014-05-08 NOTE — Progress Notes (Signed)
Psychiatric Assessment Adult  Patient Identification:  Carla Wagner Date of Evaluation:  05/08/2014 Chief Complaint: increasing depression and anxiety History of Chief Complaint: Carla Wagner says she has been more anxious and depressed in the last few weeks for no reason she can name.  She was so anxious that she could not go to work and went to the ER. She has some stressors but they do not seem to be that severe to her.  Ongoing stress with an relationship with an aunt who has always been very negative and mean towards her made worse by the family get together at Thanksgiving.  She listed many things over the years that contributed to this negative relationship.  They go to the same church and that has added to the stress.  They moved recently and her youngest son is struggling with school for the first time and she cannot figure why but blames herself for not having enough time to spend with the children.  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  Says she has to get to the bottom of this as she needs to work. She sees a therapist and her PCP provides medication which she takes sporadically as she does not like to take medications.  Chief Complaint  Patient presents with  . Anxiety  . Depression  . Stress    Anxiety     Review of Systems Physical Exam  Depressive Symptoms: depressed mood, insomnia, fatigue, difficulty concentrating, anxiety, panic attacks, weight gain, increased appetite,  (Hypo) Manic Symptoms:   Elevated Mood:  Negative Irritable Mood:  Negative Grandiosity:  Negative Distractibility:  Negative Labiality of Mood:  Negative Delusions:  Negative Hallucinations:  Negative Impulsivity:  Negative Sexually Inappropriate Behavior:  Negative Financial Extravagance:  Negative Flight of Ideas:  Negative  Anxiety Symptoms: Excessive Worry:  Yes Panic Symptoms:   Yes Agoraphobia:  Negative Obsessive Compulsive: No  Symptoms: None, Specific Phobias:  Negative Social Anxiety:  Negative  Psychotic Symptoms:  Hallucinations: Negative None Delusions:  Negative Paranoia:  Negative   Ideas of Reference:  Negative  PTSD Symptoms: Ever had a traumatic exposure:  Negative Had a traumatic exposure in the last month:  Negative Re-experiencing: Negative None Hypervigilance:  Negative Hyperarousal: Negative None Avoidance: Negative None  Traumatic Brain Injury: Negative na  Past Psychiatric History: Diagnosis: major depression, moderate, recurrent and generalized anxiety disorder  Hospitalizations: none  Outpatient Care: sees a therapist and her PCP for psychiatric meds  Substance Abuse Care: none  Self-Mutilation: none  Suicidal Attempts: none  Violent Behaviors: none   Past Medical History:   Past Medical History  Diagnosis Date  . Anxiety   . Depression    History of Loss of Consciousness:  Negative Seizure History:  Negative Cardiac History:  Negative Allergies:  No Known Allergies Current Medications:  Current Outpatient Prescriptions  Medication Sig Dispense Refill  . busPIRone (BUSPAR) 10 MG tablet Take 10 mg by mouth.    Marland Kitchen. FLUoxetine (PROZAC) 20 MG capsule Take 40 mg by mouth daily.     Marland Kitchen. losartan-hydrochlorothiazide (HYZAAR) 100-25 MG per tablet Take 1 tablet by mouth daily.    . traZODone (DESYREL) 50 MG tablet Take 50 mg by mouth at bedtime as needed for sleep.     No current facility-administered medications for this visit.    Previous Psychotropic Medications:  Medication Dose   see medications prescribed above  na  Substance Abuse History in the last 12 months: Substance Age of 1st Use Last Use Amount Specific Type  Nicotine  not given  daily  5-7  cigarettes  Alcohol  none  na  na  na  Cannabis  not given   uncertain   rarely  smokes  Opiates      Cocaine      Methamphetamines       LSD      Ecstasy      Benzodiazepines      Caffeine      Inhalants      Others:                          Medical Consequences of Substance Abuse: none  Legal Consequences of Substance Abuse: none  Family Consequences of Substance Abuse: none  Blackouts:  Negative DT's:  Negative Withdrawal Symptoms:  Negative None  Social History: Current Place of Residence: [place] Place of Birth: [place] Family Members: husband and 3 children Marital Status:  Married Children: 3  Sons: 2  Daughters: 1 Relationships: close to family Education:  [employer] Educational Problems/Performance: good student Religious Beliefs/Practices: Christian History of Abuse: none Teacher, musicccupational Experiences; Military History:  None. Legal History: none Hobbies/Interests: none reported  Family History:   Family History  Problem Relation Age of Onset  . Depression Mother     Mental Status Examination/Evaluation: Objective:  Appearance: Well Groomed  Eye Contact::  Good  Speech:  Clear and Coherent  Volume:  Normal  Mood:  anxious  Affect:  Appropriate  Thought Process:  Coherent and Logical  Orientation:  Full (Time, Place, and Person)  Thought Content:  Negative  Suicidal Thoughts:  No  Homicidal Thoughts:  No  Judgement:  Good  Insight:  Good  Psychomotor Activity:  Normal  Akathisia:  Negative  Handed:  Right  AIMS (if indicated):  0  Assets:  Communication Skills Desire for Improvement Financial Resources/Insurance Housing Intimacy Leisure Time Physical Health Resilience Social Support Talents/Skills Transportation Vocational/Educational    Laboratory/X-Ray Psychological Evaluation(s)   none  none   Assessment:  Major depression, recurrent, moderate.  Generalized Anxiety Disorder                  Treatment Plan/Recommendations:  Plan of Care: group therapy  Laboratory:  none  Psychotherapy: group therapy daily  Medications: continue current medications  Routine PRN  Medications:  Negative  Consultations: none  Safety Concerns:  none  Other:      Benjaman PottAYLOR,GERALD D, MD 12/16/20152:01 PM

## 2014-05-09 ENCOUNTER — Other Ambulatory Visit (HOSPITAL_COMMUNITY): Payer: PRIVATE HEALTH INSURANCE | Attending: Psychiatry | Admitting: Psychiatry

## 2014-05-09 DIAGNOSIS — F331 Major depressive disorder, recurrent, moderate: Secondary | ICD-10-CM

## 2014-05-09 NOTE — Progress Notes (Signed)
    Daily Group Progress Note  Program: IOP  Group Time: 9:00-10:30  Participation Level: Active  Behavioral Response: Appropriate  Type of Therapy:  Group Therapy  Summary of Progress: Pt. Reported that she was feeling "so-so". Pt. Reported that she uses sleep to numb and likes to spend time in the dark. Pt. Was talkative and responsive to group process.      Group Time: 10:30-12:00  Participation Level:  Active  Behavioral Response: Appropriate  Type of Therapy: Psycho-education Group  Summary of Progress: Pt. Participated in development of self exercise with "I am" poem.   Benjaman PottAYLOR,GERALD D, MD

## 2014-05-10 ENCOUNTER — Other Ambulatory Visit (HOSPITAL_COMMUNITY): Payer: PRIVATE HEALTH INSURANCE | Admitting: Psychiatry

## 2014-05-10 DIAGNOSIS — F331 Major depressive disorder, recurrent, moderate: Secondary | ICD-10-CM | POA: Diagnosis not present

## 2014-05-10 NOTE — Progress Notes (Signed)
    Daily Group Progress Note  Program: IOP  Group Time: 9:00-10:30  Participation Level: Active  Behavioral Response: Appropriate  Type of Therapy:  Group Therapy  Summary of Progress: Pt. Discussed musical talent and history of severe stage fright. Pt. Discussed sometimes tense relationship with her mother who she does not feel has appropriately empathized and advocated for her.      Group Time: 10:30-12:00  Participation Level:  Active  Behavioral Response: Appropriate  Type of Therapy: Psycho-education Group  Summary of Progress: Pt. Participated in group facilitated by the mental health association.   [name], Lydia Toren B, LPC

## 2014-05-10 NOTE — Progress Notes (Signed)
    Daily Group Progress Note  Program: IOP  Group Time: 9:00-10:30  Participation Level: Active  Behavioral Response: Appropriate  Type of Therapy:  Group Therapy  Summary of Progress: Pt. Reported that she felt that she should be busy over the weekend but did not feel motivation to do much unless her children pull her out of bed. Pt. Was tearful when describing her lack of energy and motivation which has prevented her from being as involved as she used to be in her children's activities.      Group Time: 10:30-12:00  Participation Level:  Active  Behavioral Response: Appropriate  Type of Therapy: Group Therapy  Summary of Progress: Pt. Participated in discussion about developing creativity and shared her singing with the group. Pt. Was receptive to feedback about moving passed her stage fright and developing confidence while performing.   [name], [name], LPC

## 2014-05-13 ENCOUNTER — Other Ambulatory Visit (HOSPITAL_COMMUNITY): Payer: PRIVATE HEALTH INSURANCE | Admitting: Psychiatry

## 2014-05-13 DIAGNOSIS — F331 Major depressive disorder, recurrent, moderate: Secondary | ICD-10-CM

## 2014-05-14 ENCOUNTER — Other Ambulatory Visit (HOSPITAL_COMMUNITY): Payer: PRIVATE HEALTH INSURANCE | Admitting: Psychiatry

## 2014-05-14 DIAGNOSIS — F331 Major depressive disorder, recurrent, moderate: Secondary | ICD-10-CM | POA: Diagnosis not present

## 2014-05-15 ENCOUNTER — Other Ambulatory Visit (HOSPITAL_COMMUNITY): Payer: PRIVATE HEALTH INSURANCE | Admitting: Psychiatry

## 2014-05-15 DIAGNOSIS — F331 Major depressive disorder, recurrent, moderate: Secondary | ICD-10-CM | POA: Diagnosis not present

## 2014-05-15 NOTE — Progress Notes (Signed)
    Daily Group Progress Note  Program: IOP  Group Time: 9:00-10:30  Participation Level: Active  Behavioral Response: Appropriate  Type of Therapy:  Group Therapy  Summary of Progress: Pt. Presented as calm, talkative. Pt. Continues to be challenged by desire to assert herself as a singer but is held back because of her stage fright and anxiety.      Group Time: 10:30-12:00  Participation Level:  Active  Behavioral Response: Appropriate  Type of Therapy: Psycho-education Group  Summary of Progress: Pt. Participated in discussion about countering cognitive distortions.   [name], Emrik Erhard B, LPC

## 2014-05-16 ENCOUNTER — Other Ambulatory Visit (HOSPITAL_COMMUNITY): Payer: PRIVATE HEALTH INSURANCE

## 2014-05-16 NOTE — Progress Notes (Signed)
    Daily Group Progress Note  Program: IOP  Group Time: 9:00-10:30  Participation Level: Active  Behavioral Response: Appropriate  Type of Therapy:  Group Therapy  Summary of Progress: Pt. Presented with bright mood, talkative and laughed appropriately. Pt. Spent time with a friend who is encouraging of her      Group Time: 10:30-12:00  Participation Level:  Active  Behavioral Response: Appropriate  Type of Therapy: Psycho-education Group  Summary of Progress: Pt. Participated in discussion about developing self-care, planned for self-care holiday party for tomorrow's group.   [name], [name], LPC

## 2014-05-16 NOTE — Progress Notes (Signed)
    Daily Group Progress Note  Program: IOP  Group Time: 9:00-10:30  Participation Level: Active  Behavioral Response: Appropriate  Type of Therapy:  Group Therapy  Summary of Progress: Pt. Presented with brightened mood, Smiled, talked appropriately, and was receptive to feedback. Pt. Discussed plans to spend christmas holiday with her family.      Group Time: 10:30-12:00  Participation Level:  Active  Behavioral Response: Appropriate  Type of Therapy: Psycho-education Group  Summary of Progress: Pt. Participated in self-care holiday party (i.e., guided imagery, aromoatherapy, and benefits of chocolate).  [name], [name], LPC

## 2014-05-20 ENCOUNTER — Other Ambulatory Visit (HOSPITAL_COMMUNITY): Payer: PRIVATE HEALTH INSURANCE | Admitting: Psychiatry

## 2014-05-21 ENCOUNTER — Other Ambulatory Visit (HOSPITAL_COMMUNITY): Payer: PRIVATE HEALTH INSURANCE | Admitting: Psychiatry

## 2014-05-21 DIAGNOSIS — F411 Generalized anxiety disorder: Secondary | ICD-10-CM

## 2014-05-21 DIAGNOSIS — F331 Major depressive disorder, recurrent, moderate: Secondary | ICD-10-CM | POA: Diagnosis not present

## 2014-05-21 NOTE — Progress Notes (Signed)
    Daily Group Progress Note  Program: IOP  Group Time: 9:00-10:30  Participation Level: Active  Behavioral Response: Appropriate  Type of Therapy:  Group Therapy  Summary of Progress: Pt. Presents with bright affect, smiles and laughs appropriately. Pt. Reports that she continues to have significant anxiety and wakes up from her sleep with panic attacks. Pt. Discusses history of poor support from her mother, aunts and extended family. Pt. Feels very supported by immediate family. Pt. Continues to be challenged by desire to pursue music and lack of work satisfaction.      Group Time: 10:30-12:00  Participation Level:  Active  Behavioral Response: Appropriate  Type of Therapy: Psycho-education Group  Summary of Progress: Pt. Participated in discussion about development of vulnerability and resilience to shame. Pt. Watched and discussed Dewain PenningBrene Brown video.   [name], [name], LPC

## 2014-05-22 ENCOUNTER — Other Ambulatory Visit (HOSPITAL_COMMUNITY): Payer: PRIVATE HEALTH INSURANCE

## 2014-05-23 ENCOUNTER — Telehealth (HOSPITAL_COMMUNITY): Payer: Self-pay | Admitting: Psychiatry

## 2014-05-23 ENCOUNTER — Other Ambulatory Visit (HOSPITAL_COMMUNITY): Payer: PRIVATE HEALTH INSURANCE | Admitting: Psychiatry

## 2014-05-27 ENCOUNTER — Other Ambulatory Visit (HOSPITAL_COMMUNITY): Payer: 59 | Attending: Psychiatry | Admitting: Psychiatry

## 2014-05-27 DIAGNOSIS — F331 Major depressive disorder, recurrent, moderate: Secondary | ICD-10-CM | POA: Insufficient documentation

## 2014-05-27 DIAGNOSIS — F1010 Alcohol abuse, uncomplicated: Secondary | ICD-10-CM | POA: Diagnosis not present

## 2014-05-27 DIAGNOSIS — F419 Anxiety disorder, unspecified: Secondary | ICD-10-CM | POA: Diagnosis not present

## 2014-05-27 NOTE — Progress Notes (Signed)
    Daily Group Progress Note  Program: IOP  Group Time: 9:00-10:30  Participation Level: Active  Behavioral Response: Appropriate  Type of Therapy:  Group Therapy  Summary of Progress: Pt. Presented with bright affect, laughed and talked appropriately. Pt. Expressed loss related to death of cousin over the holidays, role in there family, and stress of communication dynamics in large family.      Group Time: 10:30-12:00  Participation Level:  Active  Behavioral Response: Appropriate  Type of Therapy: Psycho-education Group  Summary of Progress: Pt. Participated in grief and loss facilitated by Theda Belfast.   Shaune Pollack, LPC

## 2014-05-28 ENCOUNTER — Other Ambulatory Visit (HOSPITAL_COMMUNITY): Payer: 59 | Admitting: Psychiatry

## 2014-05-29 ENCOUNTER — Other Ambulatory Visit (HOSPITAL_COMMUNITY): Payer: 59

## 2014-05-30 ENCOUNTER — Other Ambulatory Visit (HOSPITAL_COMMUNITY): Payer: 59 | Admitting: Psychiatry

## 2014-05-30 DIAGNOSIS — F331 Major depressive disorder, recurrent, moderate: Secondary | ICD-10-CM | POA: Diagnosis not present

## 2014-05-30 NOTE — Progress Notes (Signed)
    Daily Group Progress Note  Program: IOP  Group Time: 0900-1030  Participation Level: Active  Behavioral Response: Appropriate and Sharing  Type of Therapy:  Group Therapy  Summary of Progress: Pt expressed how her cousin's eulogy touched her because the pastor talked about morals and values.  Pt voiced that her husband wants her to quite her job at [employer] instead of going back to that stressful environment.  Pt is exploring the option.     Group Time: 1045-1200  Participation Level:  Active  Behavioral Response: Appropriate and Sharing  Type of Therapy: Psycho-education Group  Summary of Progress: Nutritionist Oran Rein(Laura Jobe, RD, LDN) from [facility] came and led group.  She discussed healthy eating habits, portion control and eating without distractions (i.e. TV, electronics, etc).  Provided pt with resources and websites.

## 2014-05-31 ENCOUNTER — Other Ambulatory Visit (HOSPITAL_COMMUNITY): Payer: 59 | Admitting: Psychiatry

## 2014-05-31 DIAGNOSIS — F331 Major depressive disorder, recurrent, moderate: Secondary | ICD-10-CM

## 2014-05-31 NOTE — Progress Notes (Signed)
    Daily Group Progress Note  Program: IOP  Group Time: 9:00-10:30  Participation Level: Active  Behavioral Response: Appropriate  Type of Therapy:  Group Therapy  Summary of Progress: Pt. Presented with depressed mood. Pt. Reported that she did not feel well physically or emotionally. Pt. Stated that she did not feel that antidepressant medication was working. Pt. Also reported nausea and stomach pain that she attributed to ongoing gastrointestinal problems.      Group Time: 10:30-12:00  Participation Level:  Active  Behavioral Response: Appropriate  Type of Therapy: Psycho-education Group  Summary of Progress: Pt. Participated in discussion about developing healthy relationship boundaries.   [name], [name], LPC

## 2014-06-03 ENCOUNTER — Other Ambulatory Visit (HOSPITAL_COMMUNITY): Payer: 59 | Admitting: Psychiatry

## 2014-06-03 DIAGNOSIS — F411 Generalized anxiety disorder: Secondary | ICD-10-CM

## 2014-06-03 DIAGNOSIS — F331 Major depressive disorder, recurrent, moderate: Secondary | ICD-10-CM

## 2014-06-04 ENCOUNTER — Other Ambulatory Visit (HOSPITAL_COMMUNITY): Payer: 59

## 2014-06-05 ENCOUNTER — Other Ambulatory Visit (HOSPITAL_COMMUNITY): Payer: 59 | Admitting: Psychiatry

## 2014-06-05 DIAGNOSIS — F331 Major depressive disorder, recurrent, moderate: Secondary | ICD-10-CM

## 2014-06-05 NOTE — Progress Notes (Signed)
    Daily Group Progress Note  Program: IOP  Group Time: 9:00-10:30  Participation Level: Active  Behavioral Response: Appropriate  Type of Therapy:  Psycho-education Group  Summary of Progress: Pt. Participated in medication management group facilitated by the pharmacist.      Group Time: 10:30-12:00  Participation Level:  Active  Behavioral Response: Appropriate  Type of Therapy: Group Therapy  Summary of Progress: Pt. Reported that she had a bad weekend. Pt. Reported that she felt depressed for most of the weekend, was challenged by her boundaries being pushed by her relationship with her mother. Pt. Reported that she was encouraged to learn that her oldest son was accepted into college.   [facility]

## 2014-06-06 ENCOUNTER — Other Ambulatory Visit (HOSPITAL_COMMUNITY): Payer: 59 | Admitting: Psychiatry

## 2014-06-06 DIAGNOSIS — F331 Major depressive disorder, recurrent, moderate: Secondary | ICD-10-CM | POA: Diagnosis not present

## 2014-06-06 NOTE — Patient Instructions (Signed)
Patient completed MH-IOP today.  F/U with Elana AlmFrankie Powell, LCSW on 06-07-14 @ 9 a.m and Dr. Lolly MustacheArfeen on 06-20-14 @ 9 a.m.  Encouraged support groups.  Return to work on 06-10-14 without any restrictions.

## 2014-06-06 NOTE — Progress Notes (Signed)
Carla Wagner is a 39 y.o. , married, employed, [place], who was referred per a current patient; treatment for ongoing depressive and anxiety symptoms. Pt has been struggling with: Crying spells, poor sleep, increased appetite (has gained 10 lbs within two months), decreased concentration, and no motivation. Denied SI/HI or A/V hallucinations. Reported having symptoms since the beginning of this year (March 2015). 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 Patient's maternal aunt questioned why pt was the matron and it created a lot of conflict. Pt reported there has been ongoing conflict with this particular aunt. "She has never liked me. She spreads lies on me." Pt stated that her mother never speaks up on her behalf. 2) Job ([employer]) of four years. Pt stated although she likes her job; she doesn't like the management nor coworkers. "It is very stressful and always changing."  According to pt, she has been going above her quota. 3) Unresolved grief/loss issues: Husband's health issues: He has diabetes. Brother is in chronic pain from a fatty cyst. Father has prostate cancer and is currently receiving treatment. Also, pt (along with family) recently moved to another home. 39 yo had to switch schools and is currently having a difficult time. He is getting "F's" in Social Studies and Math. Pt is internalizing it and thinking something is wrong with her parenting. The 41 yr old financial aid didn't go through for this semester, so he will be leaving to go to college Spring 2016. Pt states she is having difficulty with that transition. "He is usually right her at all times with me." 4) Hx of non-compliancy with medications. Pt denied any previous psychiatric hospitalizations. No prior suicide attempts. Has seen Dr. Lewis MoccasinEdward Morris twice for individual counseling. Pt stated she doesn't want to return to him due to scheduling  conflicts. Family Hx: Mother (hx of depression). Pt completed all days in MH-IOP.  Reports decreased anxiety; except for at night.  States she wakes up in a panic.  Anxious about returning to work on 06-10-14.  States that the groups were helpful.  Applying coping skills learned.  A:  D/C today.  F/U with Elana AlmFrankie Powell, LCSW on 06-07-14 @ 9 a.m and Dr. Lolly MustacheArfeen on 06-20-14 @ 9 a.m.  Encouraged support groups.  RTW on 06-10-14; without any restrictions.R:  Pt receptive.

## 2014-06-06 NOTE — Progress Notes (Signed)
  [facility]  Carla Wagner [id number]  Admission date: 05/08/2014 Discharge date: 06/07/2014  Reason for admission: depression and anxiety mainly from work stress  Chemical Use History: none   Family of Origin issues: none  Progress in Program Toward Treatment Goals: Anxiety decreased in the daytime but increased in sleep so that she wakes up during the night with panic.  Not as extreme as previous panic but still quite annoying.  Still very anxious about returning to work.  Depression is less and not the primary issue currently.  Progress (rationale): She says talking in group has helped, hearing others so that she does not feel that she is so alone, some coping skills were helpful.    Benjaman PottAYLOR,Meli Faley D, MD 06/06/2014

## 2014-06-06 NOTE — Progress Notes (Signed)
    Daily Group Progress Note  Program: IOP  Group Time: 9:00-10:30  Participation Level: Active  Behavioral Response: Appropriate  Type of Therapy:  Group Therapy  Summary of Progress: Pt. Discussed need to prioritize her self-care including time for herself, saying "no" to others, and scheduling doctor's visits.      Group Time: 10:30-12:00  Participation Level:  Active  Behavioral Response: Appropriate  Type of Therapy: Psycho-education Group  Summary of Progress: Pt. Participated in self-acceptance guided meditation.   [name], [name], LPC

## 2014-06-07 ENCOUNTER — Other Ambulatory Visit (HOSPITAL_COMMUNITY): Payer: 59

## 2014-06-07 ENCOUNTER — Encounter (HOSPITAL_COMMUNITY): Payer: Self-pay | Admitting: Clinical

## 2014-06-07 ENCOUNTER — Ambulatory Visit (INDEPENDENT_AMBULATORY_CARE_PROVIDER_SITE_OTHER): Payer: PRIVATE HEALTH INSURANCE | Admitting: Clinical

## 2014-06-07 DIAGNOSIS — F411 Generalized anxiety disorder: Secondary | ICD-10-CM

## 2014-06-07 DIAGNOSIS — F331 Major depressive disorder, recurrent, moderate: Secondary | ICD-10-CM | POA: Diagnosis not present
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
## 2014-06-07 NOTE — Progress Notes (Signed)
    Daily Group Progress Note  Program: IOP  Group Time: 9:00-10:30  Participation Level: Active  Behavioral Response: Appropriate  Type of Therapy:  Group Therapy  Summary of Progress: Pt. Prepared for discharge. Pt. Reported that she was feeling anxious about returning to work next day. Pt. Discussed using breathing and meditation exercises to cope with return to work.      Group Time: 10:30-12:00  Participation Level:  Active  Behavioral Response: Appropriate  Type of Therapy: Psycho-education Group  Summary of Progress: Pt. Watched and discussed Mel Robbins video about developing motivation.   Benjaman PottAYLOR,GERALD D, MD

## 2014-06-10 ENCOUNTER — Other Ambulatory Visit (HOSPITAL_COMMUNITY): Payer: 59

## 2014-06-11 ENCOUNTER — Other Ambulatory Visit (HOSPITAL_COMMUNITY): Payer: 59

## 2014-06-11 ENCOUNTER — Ambulatory Visit (HOSPITAL_COMMUNITY): Payer: Self-pay | Admitting: Clinical

## 2014-06-12 ENCOUNTER — Other Ambulatory Visit (HOSPITAL_COMMUNITY): Payer: 59

## 2014-06-13 ENCOUNTER — Other Ambulatory Visit (HOSPITAL_COMMUNITY): Payer: 59

## 2014-06-14 ENCOUNTER — Other Ambulatory Visit (HOSPITAL_COMMUNITY): Payer: 59

## 2014-06-17 ENCOUNTER — Other Ambulatory Visit (HOSPITAL_COMMUNITY): Payer: 59

## 2014-06-18 ENCOUNTER — Other Ambulatory Visit (HOSPITAL_COMMUNITY): Payer: 59

## 2014-06-19 ENCOUNTER — Other Ambulatory Visit (HOSPITAL_COMMUNITY): Payer: 59

## 2014-06-20 ENCOUNTER — Encounter (HOSPITAL_COMMUNITY): Payer: Self-pay | Admitting: Psychiatry

## 2014-06-20 ENCOUNTER — Ambulatory Visit (HOSPITAL_COMMUNITY): Payer: 59 | Admitting: Clinical

## 2014-06-20 ENCOUNTER — Ambulatory Visit (INDEPENDENT_AMBULATORY_CARE_PROVIDER_SITE_OTHER): Payer: PRIVATE HEALTH INSURANCE | Admitting: Psychiatry

## 2014-06-20 VITALS — BP 132/89 | HR 75 | Ht 71.0 in | Wt 246.0 lb

## 2014-06-20 DIAGNOSIS — F331 Major depressive disorder, recurrent, moderate: Secondary | ICD-10-CM | POA: Diagnosis not present

## 2014-06-20 DIAGNOSIS — F1010 Alcohol abuse, uncomplicated: Secondary | ICD-10-CM | POA: Diagnosis not present

## 2014-06-20 MED ORDER — TRAZODONE HCL 50 MG PO TABS: 50.0000 mg | ORAL_TABLET | Freq: Every evening | ORAL | Status: DC | PRN

## 2014-06-20 MED ORDER — LAMOTRIGINE 25 MG PO TABS: ORAL_TABLET | ORAL | Status: DC

## 2014-06-20 MED ORDER — FLUOXETINE HCL 20 MG PO CAPS: 20.0000 mg | ORAL_CAPSULE | Freq: Every day | ORAL | Status: DC

## 2014-06-20 NOTE — Progress Notes (Signed)
[facility] Behavioral Health Initial Assessment Note  Dianne Whelchel [id number] 39 y.o.  06/20/2014 10:03 AM  Chief Complaint:  I'm not getting better.  I have a lot of irritability, anger and depression.  I do not believe my medicine is working.  History of Present Illness:  Patient is 39 year old African-American, married, employed female who is referred from intensive outpatient program.  Patient was referred to intensive outpatient program from her coworker who also attended the program.  She reported having symptoms of depression, anxiety, irritability and anger for more than a year.  She has multiple stressors.  Her job is very stressful.  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  She mentioned all the time on edge.  She endorse having panic attacks and difficulty concentrating.  Her other stressors are parents are getting old, father has cancer of prostate and mother has some memory impairment.  She also mentioned having issues with her aunt which flares up on last Thanksgiving.  She is trying to learn keep distance from her aunt.  She mentioned due to her irritability, anger and mood swing she had lost friends in recent months.  She told her friends call her "llight switch".  She admitted that her mood has been highs and lows and there are times that she's been very emotional, tearful for no reason.  Patient denies any hallucination or any paranoia but admitted extreme anxiety and irritability.  She described chronic fatigue, weight gain in recent months, difficulty concentration, insomnia, discouragement, sadness and low self-esteem.  She also noticed recently loss of interest in her life but denies any active or passive suicidal thoughts or homicidal thought.  She's been taking Prozac 20 mg which is prescribed by her primary care physician along with trazodone 50 mg as needed for insomnia.  She was  also given BuSpar 10 mg however she admitted taking only as needed.  Patient also endorsed drinking alcohol some time binge on the weekends.  But denies any tremors, shakes or any withdrawal symptoms.  Patient denies any nightmares, flashback, OCD symptoms or any aggression or violence.  She started seeing Thelma Barge in this office for initial counseling.  She is open to try a different medication.  Suicidal Ideation: No Plan Formed: No Patient has means to carry out plan: No  Homicidal Ideation: No Plan Formed: No Patient has means to carry out plan: No  Past Psychiatric History/Hospitalization(s) Patient completed intensive outpatient program from December 16 to 06/07/2014.  She was given Prozac and trazodone from her primary care physician.  She will also prescribed BuSpar however she has been poorly compliant with the medication.  Patient denies any history of psychosis, aggression, violence, suicidal attempt or any inpatient psychiatric treatment. Anxiety: Yes Bipolar Disorder: History of mood swings and anger. Depression: Yes Mania: See above Psychosis: No Schizophrenia: No Personality Disorder: No Hospitalization for psychiatric illness: No History of Electroconvulsive Shock Therapy: No Prior Suicide Attempts: No  Medical History; Patient has history of back surgery, hypertension and headaches.  Recently she fell while playing with her daughter and complaining of left knee pain which has been progressively getting worse.  Her primary care physician is Dr. Harrietta Guardian at [facility].  Patient denies any history of seizures.    Traumatic brain injury: Patient denies any history of traumatic brain injury.  Family History; Patient endorse mother has depression and brother has alcoholism.  Education and Work History; Patient has high school education.  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  Psychosocial History; Patient  lives with her husband and 3 children.  She has 28 year old son, 54 year old daughter and 91 year old son.  Her husband is very supportive.  Her parents live close by.    Legal History; Patient denies any legal issues.  History Of Abuse; Patient denies any history of abuse.  Substance Abuse History; Patient endorse history of drinking alcohol heavily on the weekends but denies any tremors, shakes or any withdrawal symptoms.  Review of Systems  Constitutional: Positive for malaise/fatigue. Negative for weight loss.  Musculoskeletal:       Left knee pain and swelling  Psychiatric/Behavioral: Positive for depression and substance abuse. Negative for suicidal ideas. The patient is nervous/anxious and has insomnia.     Psychiatric: Agitation: Irritability and anger Hallucination: No Depressed Mood: Yes Insomnia: Yes Hypersomnia: No Altered Concentration: No Feels Worthless: No Grandiose Ideas: No Belief In Special Powers: No New/Increased Substance Abuse: Yes Compulsions: No  Neurologic: Headache: Yes Seizure: No Paresthesias: No   Musculoskeletal: Strength & Muscle Tone: Complaining of left knee pain Gait & Station: Difficulty walking due to knee pain Patient leans: Front   Outpatient Encounter Prescriptions as of 06/20/2014  Medication Sig  . FLUoxetine (PROZAC) 20 MG capsule Take 1 capsule (20 mg total) by mouth daily.  Marland Kitchen losartan-hydrochlorothiazide (HYZAAR) 100-25 MG per tablet Take 1 tablet by mouth daily.  Marland Kitchen omeprazole (PRILOSEC) 40 MG capsule Take 40 mg by mouth daily.  . traZODone (DESYREL) 50 MG tablet Take 1 tablet (50 mg total) by mouth at bedtime as needed for sleep.  . [DISCONTINUED] busPIRone (BUSPAR) 10 MG tablet Take 10 mg by mouth.  . [DISCONTINUED] FLUoxetine (PROZAC) 20 MG capsule Take 40 mg by mouth daily.   . [DISCONTINUED] traZODone (DESYREL) 50 MG tablet Take 50 mg by mouth at bedtime as needed for sleep.  Marland Kitchen lamoTRIgine (LAMICTAL) 25 MG tablet  Take 1 tab daily for 1 week and than 2 tab daily    No results found for this or any previous visit (from the past 2160 hour(s)).    Constitutional:  BP 132/89 mmHg  Pulse 75  Ht  (1.803 m)  Wt 246 lb (111.585 kg)  BMI 34.33 kg/m2   Mental Status Examination;  Patient is casually dressed and fairly groomed.  She appears anxious and tearful when she is talking about her job situation.  She described her mood sad and depressed and her affect is constricted.  Her speech is fluent, clear and coherent.  She denies any active or passive suicidal thoughts or homicidal thought.  She denies any auditory or visual hallucination.  Her psychomotor activity is slightly decreased.  Her thought processes circumstantial.  Her attention and concentration is fair.  There were no delusions, paranoia or any obsessive thoughts.  She has difficulty walking due to left knee pain.  There were no tremors or shakes.  She is alert and oriented 3.  Her insight judgment and impulse control is okay.   New problem, with additional work up planned, Review of Psycho-Social Stressors (1), Review and summation of old records (2), Established Problem, Worsening (2), Review of Medication Regimen & Side Effects (2) and Review of New Medication or Change in Dosage (2)  Assessment: Axis I: Major depressive disorder, recurrent moderate.  Rule out bipolar disorder depressed type.  Alcohol abuse.  Axis II: Deferred  Axis III:  Past Medical History  Diagnosis Date  . Anxiety   . Depression      Plan:  I review her symptoms, history, collateral information and her current medication.  Patient is still have irritability, anger, depression and severe mood swings.  She is taking Prozac 20 mg, trazodone 50 mg as needed and BuSpar 10 mg as needed.  Recommended to stop BuSpar as she is not taking as prescribed.  We will start Lamictal 25 mg daily and gradually increase to 50 mg in one week.  Continue Prozac 20 mg daily and  trazodone 50 mg as needed.  Discussed medication side effects special he rash and in that case she needed to stop the medication immediately.  She scheduled to see Scarlette CalicoFrances for coping and social skills.  Patient is complaining of left knee pain and I strongly recommended to see medical doctor since it is not getting better.  I recommended to call us back if she has any question, concern or if she feels worsening of the symptoms.  Discussed to stop drinking and talk about interaction with psychotropic medication and her psychiatric illness.  I will see her again in 3 weeks.Time spent 55 minutes.  More than 50% of the time spent in psychoeducation, counseling and coordination of care.  Discuss safety plan that anytime having active suicidal thoughts or homicidal thoughts then patient need to call 911 or go to the local emergency room.    Marcelus Dubberly T., MD 06/20/2014

## 2014-06-26 ENCOUNTER — Ambulatory Visit: Payer: 59

## 2014-07-11 ENCOUNTER — Ambulatory Visit (INDEPENDENT_AMBULATORY_CARE_PROVIDER_SITE_OTHER): Payer: 59 | Admitting: Psychiatry

## 2014-07-11 ENCOUNTER — Encounter (HOSPITAL_COMMUNITY): Payer: Self-pay | Admitting: Psychiatry

## 2014-07-11 VITALS — BP 126/80 | HR 75 | Ht 70.0 in | Wt 244.0 lb

## 2014-07-11 DIAGNOSIS — F1010 Alcohol abuse, uncomplicated: Secondary | ICD-10-CM

## 2014-07-11 DIAGNOSIS — Z79899 Other long term (current) drug therapy: Secondary | ICD-10-CM

## 2014-07-11 DIAGNOSIS — F331 Major depressive disorder, recurrent, moderate: Secondary | ICD-10-CM

## 2014-07-11 MED ORDER — FLUOXETINE HCL 20 MG PO CAPS: 20.0000 mg | ORAL_CAPSULE | Freq: Every day | ORAL | Status: DC

## 2014-07-11 NOTE — Progress Notes (Signed)
[facility] [id number] Progress Note  Carla Wagner [id number] 39 y.o.  07/11/2014 10:48 AM  Chief Complaint:  I have not picked up Lamictal yet.  But I'm feeling somewhat better.  I am no longer taking trazodone.    History of Present Illness:  Carla Wagner came for her follow-up appointment.  She is 39 year old African-American, married, employed female who is referred from intensive outpatient program.  Patient was started on Lamictal however she did not pick up the prescription but planning to pick up this Friday.  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  Patient works at [employer] for more than 4 years.  She mentioned her job has been stressful but she is hoping new department may not be as overwhelming.  Since the last visit she has one panic attack yesterday which last only for a few minutes.  Patient is not sure what triggered that but believed could be moving to a new department.  She sleeping better and does not need trazodone.  We have stopped the BuSpar on her last visit.  Though for anxiety attacks are better but she still have irritability, anger, rage and severe mood swings.  She still have discouragement, sadness and low self-esteem.  She is seeing Thelma BargeFrancis for counseling.  She still have moments of feeling overwhelmed but decreased attention and concentration.  She reported her mood is a light switch and her anger goes 0-60 .  She is hoping that new medicine will address those issues.  She requested job accommodation for her work.  Patient denies any hallucination or any paranoia.  She denies any feeling of hopelessness or worthlessness.  She is tolerating her medication without any side effects.  She has cut down her drinking a lot since the last visit.  She denies any binge but continues to drink some time on the weekends.  Patient denies any other illegal substance use.  Her appetite is okay.  She is happy that she lost weight from the  last visit.  Her vitals are stable.  Suicidal Ideation: No Plan Formed: No Patient has means to carry out plan: No  Homicidal Ideation: No Plan Formed: No Patient has means to carry out plan: No  Past Psychiatric History/Hospitalization(s) Patient completed intensive outpatient program from December 16 to January 15th, 2016.  She was given Prozac and trazodone from her primary care physician.  She was also prescribed BuSpar however she has been poorly compliant with the medication.  Patient denies any history of psychosis, aggression, violence, suicidal attempt or any inpatient psychiatric treatment. Anxiety: Yes Bipolar Disorder: History of mood swings and anger. Depression: Yes Mania: See above Psychosis: No Schizophrenia: No Personality Disorder: No Hospitalization for psychiatric illness: No History of Electroconvulsive Shock Therapy: No Prior Suicide Attempts: No  Medical History; Patient has history of back surgery, hypertension and headaches.  Physician [facility] Har[facility].  Her last blood work was on 04/29/2014 .  Patient denies any history of seizures.    Review of Systems  Constitutional: Positive for weight loss.  Psychiatric/Behavioral: Positive for substance abuse. Negative for suicidal ideas. The patient is nervous/anxious.        Irritability    Psychiatric: Agitation: Irritability and anger Hallucination: No Depressed Mood: No Insomnia: No Hypersomnia: No Altered Concentration: No Feels Worthless: No Grandiose Ideas: No Belief In Special Powers: No New/Increased Substance Abuse: Yes Compulsions: No  Neurologic: Headache: Yes Seizure: No Paresthesias: No  Musculoskeletal: Strength & Muscle Tone: within normal limits Gait & Station: normal Patient leans: N/A   Outpatient Encounter Prescriptions as of 07/11/2014  Medication Sig  . FLUoxetine (PROZAC) 20 MG capsule Take 1 capsule (20 mg total) by mouth daily.  Marland Kitchen  lamoTRIgine (LAMICTAL) 25 MG tablet Take 1 tab daily for 1 week and than 2 tab daily  . losartan-hydrochlorothiazide (HYZAAR) 100-25 MG per tablet Take 1 tablet by mouth daily.  Marland Kitchen omeprazole (PRILOSEC) 40 MG capsule Take 40 mg by mouth daily.  . [DISCONTINUED] FLUoxetine (PROZAC) 20 MG capsule Take 1 capsule (20 mg total) by mouth daily.  . [DISCONTINUED] traZODone (DESYREL) 50 MG tablet Take 1 tablet (50 mg total) by mouth at bedtime as needed for sleep.    No results found for this or any previous visit (from the past 2160 hour(s)).    Constitutional:  BP 126/80 mmHg  Pulse 75  Ht  (1.778 m)  Wt 244 lb (110.678 kg)  BMI 35.01 kg/m2   Mental Status Examination;  Patient is casually dressed and fairly groomed.  She maintained fair eye contact.  She described her mood anxious and her affect is appropriate.  Her speech is fluent, clear and coherent.  She denies any active or passive suicidal thoughts or homicidal thought.  She denies any auditory or visual hallucination.  Her psychomotor activity is slightly decreased.  Her thought processes circumstantial.  Her attention and concentration is fair.  There were no delusions, paranoia or any obsessive thoughts.  She has difficulty walking due to left knee pain.  There were no tremors or shakes.  She is alert and oriented 3.  Her insight judgment and impulse control is okay.   Established Problem, Stable/Improving (1), Review of Psycho-Social Stressors (1), Review and summation of old records (2), Established Problem, Worsening (2), Review of Last Therapy Session (1), Review of Medication Regimen & Side Effects (2) and Review of New Medication or Change in Dosage (2)  Assessment: Axis I: Major depressive disorder, recurrent moderate.  Rule out bipolar disorder depressed type.  Alcohol abuse.  Axis II: Deferred  Axis III:  Past Medical History  Diagnosis Date  . Anxiety   . Depression      Plan:  I review her records from her  primary care physician.  She has seen Dr. Maralyn Sago on December 7 and her normal CBC and comprehensive metabolic panel was normal other than she has slightly elevated blood sugar.  There were no hemoglobin A1c and I will order hemoglobin A1c .  She has not started Lamictal yet but hoping to pick up tomorrow.  She continues to have irritability and anger.  Overall she is feeling better with Prozac.  She is no longer taking trazodone.  I will continue Prozac 20 mg daily.  Encouraged to keep appointment with Scarlette Calico for coping and social skills.  Discussed medication side effects and recommended if she had any rash with the Lamictal that she needed to stop the medication immediately.  Patient is requesting to provide a letter for job accommodation .  She will bring forms to fill up and we discussed that she can take 2-3 days every 4 weeks if needed and may be 5-10 minute break if she has panic attack.  Patient has cut down her drinking and I discussed to stop drinking completely as it interferes with her psychotropic medication and her psychiatric illness.  Recommended to call us back if she has any question or any concern.  Follow-up  in 4 weeks. Time spent 25 minutes.  More than 50% of the time spent in psychoeducation, counseling and coordination of care.  Discuss safety plan that anytime having active suicidal thoughts or homicidal thoughts then patient need to call 911 or go to the local emergency room.   ARFEEN,SYED T., MD 07/11/2014

## 2014-07-23 ENCOUNTER — Telehealth (HOSPITAL_COMMUNITY): Payer: Self-pay | Admitting: *Deleted

## 2014-07-23 NOTE — Telephone Encounter (Signed)
We received FMLA paperwork that was dropped off from the patient for our office to fill out.  The FMLA paperwork we received from patient the ink is messed up on some pages and some parts of the documents you cannot read.  I contacted [employer] [facility](place where paperwork came from) on 07-18-14 and requested new paperwork to be faxed.  Their number is [phone]. Paperwork was never faxed on 07-18-14, called back and I was told the system was down will be faxed again on Friday 07-19-14. Paperwork never came on Friday.  I called Monday 07-22-14 at 3:30 pm was told by [facility] that paperwork will be faxed again, he was unsure of why it has not come to our office yet.  Paperwork never came by end day on Monday, 07-22-14 and nothing on fax this morning, Tuesday, 07-23-14.  Called [employer] [facility] back and spoke with Kathlene NovemberMike. Per Kathlene NovemberMike at [employer] they sent e-mail to [name] about issue going on and patient has the disability paperwork in her e-mail that she can reprint as well. I advised Kathlene NovemberMike at [employer] that every time I called no one has advised me that patient had blank copies of disability paperwork e-mailed to her, I was just told every time I called [employer], not sure what the issue is and we will refax paperwork. Per Kathlene NovemberMike at [employer] the paperwork is faxed through the system not a fax machine. I checked faxed number that [employer] had, Kathlene NovemberMike stated they have same, correct fax number.  I advised Kathlene NovemberMike at [employer] to make sure the computer has our area code [place] in system, Kathlene NovemberMike stated they have it in right. Per Kathlene NovemberMike at [employer] he apologizes for the miscommunication and stated that patient can just reprint the disability paperwork from a better printer. I advised Kathlene NovemberMike at [employer] I have attempted to reach patient, left her a message, waiting on call back. Per Kathlene NovemberMike at [employer] Jae DireKate is responsible for the patient's disability claim but she is not in until 10 am and he will tell Jae DireKate to resend the paperwork as  well.  *I left message on patient's voicemail for patient to call office back so that I can make patient aware of the situation and see if patient can get a better copy of the disability forms to us.

## 2014-07-25 NOTE — Telephone Encounter (Signed)
Attempted to reach patient. Left message for call back.

## 2014-07-26 NOTE — Telephone Encounter (Signed)
Attempted to reach patient. Left message for call back. 

## 2014-08-08 ENCOUNTER — Ambulatory Visit (HOSPITAL_COMMUNITY): Payer: Self-pay | Admitting: Psychiatry

## 2014-08-08 ENCOUNTER — Encounter (HOSPITAL_COMMUNITY): Payer: Self-pay | Admitting: Psychiatry

## 2014-09-30 ENCOUNTER — Telehealth (HOSPITAL_COMMUNITY): Payer: Self-pay

## 2014-09-30 NOTE — Telephone Encounter (Signed)
Telephone call with patient to follow up on her message questioning why her FMLA paperwork had not been completed and sent in.  Reviewed patient's record and informed Carla Wagner of attempts to reach her to bring in another copy of the paperwork that was not damaged per instruction of [employer].  Informed of multiple attempts to have [employer] fax over a new copy of the forms as her past ones left to be filled out were ruined with a black smear on them and unable to read.  Questioned if Carla Wagner had changed phones as she was calling from a different phone today [phone]) and the one we had been calling was [phone].  Carla Wagner stated our office should have been able to leave a message on that voicemail as this nurse informed her that is what was done on 07/23/14, 07/25/14 and 07/26/14.  Requested Carla Wagner bring in new forms that could be read if she wanted us to complete the forms for her and she agreed with plan.  Carla Wagner will be bringing in new FMLA paperwork to be completed.

## 2014-10-07 ENCOUNTER — Telehealth (HOSPITAL_COMMUNITY): Payer: Self-pay

## 2014-10-07 NOTE — Telephone Encounter (Signed)
Medication management - patient called to question if [employer] forms had been returned she brought in for Dr. Lolly MustacheArfeen.  Left message Dr. Lolly MustacheArfeen out until 10/09/14 and they could not be done until then. Called [employer] [facility] at [phone] to request and extension on paperwork for patient due to MD out of the office until 10/09/14.  Drinda Buttsnnette with [employer] verified the deadline for patient's submission had been changed to Oct 17, 2014 now to allow Dr. Lolly MustacheArfeen time to complete forms.   Called patient to inf

## 2014-10-17 ENCOUNTER — Telehealth (HOSPITAL_COMMUNITY): Payer: Self-pay | Admitting: Psychiatry

## 2014-10-17 NOTE — Telephone Encounter (Signed)
A:  Faxed completed work accommodation form to [employer]  Copy will be at front desk.  Patient states that Dr. Lolly MustacheArfeen said he would give her time to attend appointments also.  Will discuss with Dr. Lolly MustacheArfeen before adding.  R:  Pt receptive.

## 2014-10-17 NOTE — Telephone Encounter (Signed)
D:  Informed Everlene BallsShawn Taylor, RN about patient's concern about needing time for appointments (psychiatrist and therapist).  Shawn instructed writer to add that pt be allowed two hours, one-two times, every 1-2 months to be seen by providers.  A:  Will have Dr. Lolly MustacheArfeen to sign the revision.  Informed pt.  R:  Pt receptive.

## 2014-11-05 ENCOUNTER — Encounter (HOSPITAL_COMMUNITY): Payer: Self-pay | Admitting: Clinical

## 2014-11-05 ENCOUNTER — Ambulatory Visit (INDEPENDENT_AMBULATORY_CARE_PROVIDER_SITE_OTHER): Payer: PRIVATE HEALTH INSURANCE | Admitting: Clinical

## 2014-11-05 DIAGNOSIS — F319 Bipolar disorder, unspecified: Secondary | ICD-10-CM

## 2014-11-05 DIAGNOSIS — F4310 Post-traumatic stress disorder, unspecified: Secondary | ICD-10-CM

## 2014-11-07 NOTE — Progress Notes (Signed)
   THERAPIST PROGRESS NOTE  Session Time: 10:00 -10:58  Participation Level: Active  Behavioral Response: CasualAlertAnxious  Type of Therapy: Individual Therapy  Treatment Goals addressed: Reassessment and improve psychiatric symptoms  Interventions: Motivational Interviewing and grounding and mindfulness techniques  Summary: Carla Wagner is a 38 y.o. female who presents with Bipolar I and PTSD.   Suicidal/Homicidal: Nowithout intent/plan  Therapist Response: Carla Wagner met with clinician for an individual session. She had come to therapist for an Assessment in January 2016. She had not returned for her follow up appointment. She reported that she was hoping she could do it on her own but it had become worse. At the time of the initial assessment I had diagnosed her with depression and anxiety to rule out for bipolar. Carla Wagner was able to provide a more detailed response about her symptoms and past. Her symptoms are more in alignment with bipolar and PTSD (with anxiety and panic attacks). The assessment was updated to reflect this current diagnosis.  Carla Wagner and clinician discussed her goals for therapy. Clinician introduced some grounding and mindfulness technique. Clinician explained the process and purpose. Carla Wagner agreed to practice the techniques until next session,  Plan: Return again in 1 weeks.  Diagnosis: Axis I: Bipolar I and PTSD       Powell,Frances A, LCSW 11/07/2014  
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
## 2014-11-08 ENCOUNTER — Ambulatory Visit (HOSPITAL_COMMUNITY): Payer: Self-pay | Admitting: Psychiatry

## 2014-12-03 ENCOUNTER — Ambulatory Visit (INDEPENDENT_AMBULATORY_CARE_PROVIDER_SITE_OTHER): Payer: PRIVATE HEALTH INSURANCE | Admitting: Clinical

## 2014-12-03 ENCOUNTER — Encounter (HOSPITAL_COMMUNITY): Payer: Self-pay | Admitting: Clinical

## 2014-12-03 DIAGNOSIS — F4310 Post-traumatic stress disorder, unspecified: Secondary | ICD-10-CM

## 2014-12-03 DIAGNOSIS — F319 Bipolar disorder, unspecified: Secondary | ICD-10-CM

## 2014-12-05 NOTE — Progress Notes (Signed)
   THERAPIST PROGRESS NOTE  Session Time: 3:30 -4:28  Participation Level: Active  Behavioral Response: CasualAlertDepressed  Type of Therapy: Individual Therapy  Treatment Goals addressed:  improve psychiatric symptoms  Interventions: Motivational Interviewing and cbt  Summary: Carla Wagner is a 39 y.o. female who presents with Bipolar I and PTSD.   Suicidal/Homicidal: Nowithout intent/plan  Therapist Response: Carla Wagner met with a Carla Wagner for an individual session. Carla Wagner discussed her current life events, her homework and her psychiatric symptoms. Carla Wagner shared that she had experienced a bit of Mania since she last saw me. She shared about how she became hyper focused on decorating her bathroom. She shared about purchases she would not otherwise make. Client and Carla Wagner discussed signs that she might be about to experience mania and what she could do to improve her outcome. Carla Wagner share about how her relationships are affected by her symptoms.  Carla Wagner shared her thoughts and insights about the experience.She shared her desire to be stable and to have a better understanding of her mental health.  Carla Wagner gave Carla Wagner a packet on Bipolar which she agreed to complete for homework.  Client and Carla Wagner reviewed and discussed her grounding and mindfulness technique practice. Carla Wagner introduced additional techniques which client and Carla Wagner practiced together. Carla Wagner agreed to practice daily until next session. Carla Wagner shared that she felt good about addressing her "issues".    Plan: Return again in 1 weeks.  Diagnosis:Axis I: Bipolar I and PTSD    Carla Wagner A, LCSW 12/05/2014

## 2014-12-06 ENCOUNTER — Encounter (HOSPITAL_COMMUNITY): Payer: Self-pay | Admitting: Emergency Medicine

## 2014-12-10 ENCOUNTER — Encounter (HOSPITAL_COMMUNITY): Payer: Self-pay | Admitting: Clinical

## 2014-12-10 ENCOUNTER — Ambulatory Visit (INDEPENDENT_AMBULATORY_CARE_PROVIDER_SITE_OTHER): Payer: PRIVATE HEALTH INSURANCE | Admitting: Clinical

## 2014-12-10 DIAGNOSIS — F4310 Post-traumatic stress disorder, unspecified: Secondary | ICD-10-CM

## 2014-12-10 DIAGNOSIS — F411 Generalized anxiety disorder: Secondary | ICD-10-CM

## 2014-12-10 DIAGNOSIS — F319 Bipolar disorder, unspecified: Secondary | ICD-10-CM

## 2014-12-10 NOTE — Progress Notes (Signed)
   THERAPIST PROGRESS NOTE  Session Time: 1:36 -2:33  Participation Level: Active  Behavioral Response: NeatAlertDepressed  Type of Therapy: Individual Therapy  Treatment Goals addressed: improve psychiatric symptoms, emotional regulation skills, healthy self-esteem skills, improve faulty thinking  Interventions: Motivational Interviewing, CBT, Grounding and Mindfulness Techniques   Summary: [name] is a 39 y.o. female who presents with Bipolar I and PTSD  Suicidal/Homicidal: No -without intent/plan  Therapist Response:  [name] met with clinician for an individual session. [name] discussed her psychiatric symptoms, her current life events and her homework. [name] shared that she had completed her homework. Client and clinician reviewed and discussed her homework. [name] shared that she liked the packet as it was helping her to understand her diagnosis. [name] shared her experience using the grounding and mindfulness techniques. Client and clinician practiced a few additional techniques together. [name] shared that she loved to sing. Client and clinician discussed how she could incorporate singing into her technique practice. [name] shared some about her family dynamics and how they affect her mental health. [name] shared that she becomes overwhelmed and feels that she can not trust anyone. [name] cried as some examples. Client and clinician discussed her beliefs and reactions to the events. [name] and clinician discussed how she might shift her thoughts and reactions to improve her mood. [name] and clinician discussed her thoughts about healthy boundaries. Client and clinician agreed to continue the discussion further at future sessions.  [name] shared that she felt better about herself at the end of the session and would continue her homework until next session.  Plan: Return again in 1 week.  Diagnosis:  Axis I: Bipolar I and PTSD    Ayana Imhof A,  LCSW 12/10/2014

## 2014-12-17 ENCOUNTER — Ambulatory Visit (INDEPENDENT_AMBULATORY_CARE_PROVIDER_SITE_OTHER): Payer: PRIVATE HEALTH INSURANCE | Admitting: Clinical

## 2014-12-17 DIAGNOSIS — F319 Bipolar disorder, unspecified: Secondary | ICD-10-CM

## 2014-12-17 DIAGNOSIS — F4310 Post-traumatic stress disorder, unspecified: Secondary | ICD-10-CM | POA: Diagnosis not present

## 2014-12-17 NOTE — Progress Notes (Signed)
   THERAPIST PROGRESS NOTE  Session Time: 4:26 - 5:28  Participation Level: Active  Behavioral Response: CasualAlertDepressed  Type of Therapy: Individual Therapy  Treatment Goals addressed: improve psychiatric symptoms, emotional regulation skills, healthy self-esteem skills, improve faulty thinking  Interventions: Motivational Interviewing, CBT,   Summary: Carla Wagner is a 39 y.o. female who presents with Bipolar I and PTSD  Suicidal/Homicidal: No -without intent/plan  Therapist Response:  Carla Wagner met with clinician for an individual session. Carla Wagner discussed her psychiatric symptoms, her current life events and her homework. Carla Wagner shared that she had completed her homework and client and clinician reviewed and discussed it. Carla Wagner shared that she was finding the cbt packet helpful. She shared that sometimes she forgets to do her mindfulness and grounding techniques but they are also helpful. Carla Wagner shared about singing. She shared that she was working to use singing as a Scientist, clinical (histocompatibility and immunogenetics). Client and clinician discussed this weeks self esteem homework.Carla Wagner shared some thoughts and insights she had about her family dynamics and her self esteem. Client and clinician discussed how she could use the information to move forward rather than stay stuck in patterns in the past. Carla Wagner shared that therapy is hard work but she is invested in hopes that her life will improve. Carla Wagner agreed to continue her home work until next session.    Plan: Return again in 1 week.  Diagnosis:  Axis I: Bipolar I and PTSD   Carla Wagner A, LCSW 12/17/2014

## 2014-12-24 ENCOUNTER — Ambulatory Visit (INDEPENDENT_AMBULATORY_CARE_PROVIDER_SITE_OTHER): Payer: PRIVATE HEALTH INSURANCE | Admitting: Clinical

## 2014-12-24 ENCOUNTER — Encounter (HOSPITAL_COMMUNITY): Payer: Self-pay | Admitting: Clinical

## 2014-12-24 DIAGNOSIS — F319 Bipolar disorder, unspecified: Secondary | ICD-10-CM

## 2014-12-24 DIAGNOSIS — F4310 Post-traumatic stress disorder, unspecified: Secondary | ICD-10-CM

## 2014-12-24 NOTE — Progress Notes (Signed)
   THERAPIST PROGRESS NOTE  Session Time: 1:35 - 2:29  Participation Level: Active  Behavioral Response: NeatAlertAnxious and Depressed  Type of Therapy: Individual Therapy  Treatment Goals addressed: improve psychiatric symptoms, emotional regulation skills, healthy self-esteem skills, improve faulty thinking  Interventions: Motivational Interviewing, CBT, Grounding and Mindfulness Techniques   Summary: Carla Wagner is a 39 y.o. female who presents with Bipolar I and PTSD  Suicidal/Homicidal: No -without intent/plan  Therapist Response:  Carla Wagner met with clinician for an individual session. Ivee discussed her psychiatric symptoms, her current life events and her homework. Carla Wagner shared that she had been experiencing a lot of anxiety related to her job. She shared that he work was not supportive of her temporary leave or her attending therapy. Client and clinician discussed her stress management techniques, her grounding and mindfulness practice. Carla Wagner shared that she recognized she could be more diligent in her practice. Carla Wagner and clinician reviewed and discussed her self esteem homework. Carla Wagner shared that it made her think about things that she doesn't like to think about. Client and clinician discussed why it was important and the process of shifting old beliefs. Carla Wagner shared her thoughts and insights from her homework. Carla Wagner shared that she planned to start taking her Prozac. Client and clinician discussed mental health medication and how it can assist her so that she does not have to work so hard for progress. Carla Wagner and clinician discussed how she can also support herself through good sleep hygiene, eating right and exercise. Carla Wagner agreed to continue her homework until next session.  Plan: Return again in 1 week.  Diagnosis:  Axis I: Bipolar I and PTSD .   Peter Keyworth A, LCSW 12/24/2014

## 2014-12-28 ENCOUNTER — Encounter (HOSPITAL_COMMUNITY): Payer: Self-pay | Admitting: Clinical

## 2014-12-31 ENCOUNTER — Encounter (HOSPITAL_COMMUNITY): Payer: Self-pay | Admitting: Psychiatry

## 2014-12-31 ENCOUNTER — Ambulatory Visit (INDEPENDENT_AMBULATORY_CARE_PROVIDER_SITE_OTHER): Payer: PRIVATE HEALTH INSURANCE | Admitting: Psychiatry

## 2014-12-31 VITALS — BP 118/80 | HR 88 | Ht 71.0 in | Wt 241.0 lb

## 2014-12-31 DIAGNOSIS — F331 Major depressive disorder, recurrent, moderate: Secondary | ICD-10-CM | POA: Diagnosis not present

## 2014-12-31 DIAGNOSIS — F1010 Alcohol abuse, uncomplicated: Secondary | ICD-10-CM

## 2014-12-31 MED ORDER — FLUOXETINE HCL 40 MG PO CAPS: 40.0000 mg | ORAL_CAPSULE | Freq: Every day | ORAL | Status: DC

## 2015-01-01 ENCOUNTER — Encounter (HOSPITAL_COMMUNITY): Payer: Self-pay | Admitting: Psychiatry

## 2015-01-01 NOTE — Progress Notes (Signed)
[facility] [address] Progress Note  Carla Wagner [id number] 39 y.o.  01/01/2015 8:35 AM  Chief Complaint:  I finally decided to take Prozac.  I'm feeling better.  I'm not taking Lamictal.      History of Present Illness:  Carla Wagner came for her follow-up appointment.  She was last seen in February and after that she missed appointment .  She continued to have irritability, anger and mood swing and finally she decided to take medication.  Now she is taking Prozac 40 mg every day.  She has noticed much improvement in her mood and depression and irritability.  She is not taking Lamictal however she is not more open to take mood stabilizer if her symptoms continues to get worse.  She has cut down her drinking .  She still have episodes of drinking but denies any binge or any intoxication.  She admitted stressful job and recently we had filled her FMLA forms and she is able to get time off to see her therapist.  She started counseling with Thelma Barge.  Though she denies any paranoia or any hallucination but she still have sadness, discouragement and low self-esteem.  She is working on these issues with her therapist.  She reported no side effects from Prozac.  Her mood swings are also less intense.  She sleeping better.  She still have racing thoughts but no hallucination.  She forgot to have her blood work which was recommended on her last visit.  She has no tremors or shakes.  She has no suicidal thoughts.  Her vitals are okay.  Her appetite is okay.  Patient lives with her husband and 3 children.  Patient work at [employer]  Suicidal Ideation: No Plan Formed: No Patient has means to carry out plan: No  Homicidal Ideation: No Plan Formed: No Patient has means to carry out plan: No  Past Psychiatric History/Hospitalization(s) Patient completed intensive outpatient program from December 16 to January 15th, 2016.  She was given Prozac and trazodone from her primary care physician.  She was also  prescribed BuSpar however she has been poorly compliant with the medication.  Patient denies any history of psychosis, aggression, violence, suicidal attempt or any inpatient psychiatric treatment. Anxiety: Yes Bipolar Disorder: History of mood swings and anger. Depression: Yes Mania: See above Psychosis: No Schizophrenia: No Personality Disorder: No Hospitalization for psychiatric illness: No History of Electroconvulsive Shock Therapy: No Prior Suicide Attempts: No  Medical History; Patient has history of back surgery, hypertension and headaches.  Physician is Dr. Harrietta Guardian at [facility].  Her last blood work was on 04/29/2014 .  Patient denies any history of seizures.    Review of Systems  Constitutional: Negative.   HENT: Negative for hearing loss.   Cardiovascular: Negative for chest pain and palpitations.  Skin: Negative.   Neurological: Negative for dizziness, tremors and headaches.  Psychiatric/Behavioral: Negative for suicidal ideas. The patient is nervous/anxious.        Irritability    Psychiatric: Agitation: Irritability  Hallucination: No Depressed Mood: No Insomnia: No Hypersomnia: No Altered Concentration: No Feels Worthless: No Grandiose Ideas: No Belief In Special Powers: No New/Increased Substance Abuse: No Compulsions: No  Neurologic: Headache: Yes Seizure: No Paresthesias: No   Musculoskeletal: Strength & Muscle Tone: within normal limits Gait & Station: normal Patient leans: N/A   Outpatient Encounter Prescriptions as of 12/31/2014  Medication Sig  . omeprazole-sodium bicarbonate (ZEGERID) 40-1100 MG per capsule Take 1 capsule by mouth.  Marland Kitchen acetaminophen (  TYLENOL) 500 MG tablet Take 1,000 mg by mouth every 6 (six) hours as needed for moderate pain.  Marland Kitchen FLUoxetine (PROZAC) 40 MG capsule Take 1 capsule (40 mg total) by mouth daily.  Marland Kitchen losartan-hydrochlorothiazide (HYZAAR) 100-25 MG per tablet Take 1 tablet by mouth daily.  .  [DISCONTINUED] busPIRone (BUSPAR) 10 MG tablet Take 10 mg by mouth 2 (two) times daily.  . [DISCONTINUED] FLUoxetine (PROZAC) 20 MG capsule Take 1 capsule (20 mg total) by mouth daily. (Patient not taking: Reported on 11/05/2014)  . [DISCONTINUED] FLUoxetine (PROZAC) 40 MG capsule Take 40 mg by mouth daily.  . [DISCONTINUED] lamoTRIgine (LAMICTAL) 25 MG tablet Take 1 tab daily for 1 week and than 2 tab daily (Patient not taking: Reported on 12/03/2014)  . [DISCONTINUED] LORazepam (ATIVAN) 1 MG tablet Take 0.5-1 tablets (0.5-1 mg total) by mouth 3 (three) times daily as needed for anxiety. (Patient not taking: Reported on 12/17/2014)  . [DISCONTINUED] losartan-hydrochlorothiazide (HYZAAR) 100-25 MG per tablet Take 1 tablet by mouth daily.  . [DISCONTINUED] omeprazole (PRILOSEC) 40 MG capsule Take 40 mg by mouth daily.  . [DISCONTINUED] omeprazole (PRILOSEC) 40 MG capsule Take 40 mg by mouth daily.   No facility-administered encounter medications on file as of 12/31/2014.    No results found for this or any previous visit (from the past 2160 hour(s)).    Constitutional:  BP 118/80 mmHg  Pulse 88  Ht 5\' 11"  (1.803 m)  Wt 241 lb (109.317 kg)  BMI 33.63 kg/m2  LMP  (Exact Date)   Mental Status Examination;  Patient is casually dressed and fairly groomed.  She maintained fair eye contact.  She described her mood euthymic and her affect is appropriate.  Her speech is fluent, clear and coherent.  She denies any active or passive suicidal thoughts or homicidal thought.  She denies any auditory or visual hallucination.  Her psychomotor activity is slightly decreased.  Her thought processes circumstantial.  Her attention and concentration is fair.  There were no delusions, paranoia or any obsessive thoughts.  She has difficulty walking due to left knee pain.  There were no tremors or shakes.  She is alert and oriented 3.  Her insight judgment and impulse control is okay.   Established Problem,  Stable/Improving (1), Review of Psycho-Social Stressors (1), Review or order clinical lab tests (1), Review and summation of old records (2), Review of Last Therapy Session (1), Review of Medication Regimen & Side Effects (2) and Review of New Medication or Change in Dosage (2)  Assessment: Axis I: Major depressive disorder, recurrent moderate.  Rule out bipolar disorder depressed type.  Alcohol abuse.  Axis II: Deferred  Axis III:  Past Medical History  Diagnosis Date  . Anxiety   . Depression      Plan:  I review her records and her current medication.  Patient never picked up the Lamictal.  I will discontinue that.  She is taking Prozac 40 mg and she has seen much improvement.  I will continue Prozac 40 mg daily.  Discuss noncompliance with medication and follow-up appointments.  Reinforce hemoglobin A1c .  Discussed alcohol abuse and interaction with psycho topic medication.  Patient acknowledges and agreed with the plan.  I encouraged to keep appointment with Scarlette Calico for coping and social skills.  At this time we will defer any mood stabilizer however I will see her in 6 weeks and we will determine adding mood stabilizer if needed.  Recommended to call us back if she has  any question or any concern.  Discuss safety plan that anytime having active suicidal thoughts or homicidal thoughts and she need to call 911 or go to the local emergency room.  Follow-up in 6 weeks. Time spent 25 minutes.  More than 50% of the time spent in psychoeducation, counseling and coordination of care.    Scharlene Catalina T., MD 01/01/2015

## 2015-01-13 ENCOUNTER — Ambulatory Visit (INDEPENDENT_AMBULATORY_CARE_PROVIDER_SITE_OTHER): Payer: 59 | Admitting: Neurology

## 2015-01-13 ENCOUNTER — Encounter: Payer: Self-pay | Admitting: Neurology

## 2015-01-13 VITALS — BP 128/70 | HR 78 | Resp 18 | Ht 71.0 in | Wt 239.7 lb

## 2015-01-13 DIAGNOSIS — F329 Major depressive disorder, single episode, unspecified: Secondary | ICD-10-CM | POA: Insufficient documentation

## 2015-01-13 DIAGNOSIS — G43019 Migraine without aura, intractable, without status migrainosus: Secondary | ICD-10-CM | POA: Diagnosis not present

## 2015-01-13 DIAGNOSIS — Z720 Tobacco use: Secondary | ICD-10-CM | POA: Insufficient documentation

## 2015-01-13 DIAGNOSIS — I10 Essential (primary) hypertension: Secondary | ICD-10-CM | POA: Insufficient documentation

## 2015-01-13 DIAGNOSIS — H8110 Benign paroxysmal vertigo, unspecified ear: Secondary | ICD-10-CM

## 2015-01-13 DIAGNOSIS — F32A Depression, unspecified: Secondary | ICD-10-CM | POA: Insufficient documentation

## 2015-01-13 MED ORDER — SUMATRIPTAN SUCCINATE 100 MG PO TABS: ORAL_TABLET | ORAL | Status: DC

## 2015-01-13 NOTE — Patient Instructions (Addendum)
Migraine Recommendations: 1.  We will hold off on taking a daily preventative.  Call if you would like to start one, such as topiramate as discussed 2.  Take sumatriptan  at earliest onset of headache.  May repeat dose once in 2 hours if needed.  Do not exceed two tablets in 24 hours. 3.  Limit use of pain relievers to no more than 2 days out of the week.  These medications include acetaminophen, ibuprofen, triptans and narcotics.  This will help reduce risk of rebound headaches.  STOP BUPAP 4.  Be aware of common food triggers such as processed sweets, processed foods with nitrites (such as deli meat, hot dogs, sausages), foods with MSG, alcohol (such as wine), chocolate, certain cheeses, certain fruits (dried fruits, some citrus fruit), vinegar, diet soda. 4.  Avoid caffeine 5.  Routine exercise 6.  Proper sleep hygiene 7.  Stay adequately hydrated with water 8.  Keep a headache diary. 9.  Maintain proper stress management. 10.  Do not skip meals. 11.  Follow up in 3 months

## 2015-01-13 NOTE — Progress Notes (Signed)
NEUROLOGY CONSULTATION NOTE  Carla Wagner MRN: [id number] DOB: [birth year]  Referring provider: Lindaann Pascal, PA Primary care provider: Lindaann Pascal, PA  Reason for consult:  headache  HISTORY OF PRESENT ILLNESS: Carla Wagner is a 40 year old right-handed female with tobacco abuse, depression and hypertension who presents for headache.  History obtained from patient and PCP note.  Labs and MRI brain images reviewed.  Onset:  February 2016, hit the right side of back of head on headboard.  No loss of consciousness. Location:  Starts in back of head on right side, up the right side of head and across the front. Quality:  Aching in back of head, shooting over the right side of head, throbbing of the forehead Intensity:  8-9/10 Aura:  no Prodrome:  no Associated symptoms:  Nausea, photophobia, phonophobia, lightheadedness.  No vomiting or visual disturbance. Duration:  2-3 days Frequency:  2 to 4 times a month (approximately 15 headache days total per month), however no headaches in past two weeks. Triggers/exacerbating factors:  Light, walking around Relieving factors:  sleep Activity:  Needs to lay down  Past abortive medication:  Tylenol, Norco.  Unable to take NSAIDs due to upset stomach. Past preventative medication:  none Other past therapy:  none  Current abortive medication:  Bupap -  Antihypertensive medications:  losartan-HCTZ Antidepressant medications:  fluoxetine  Anticonvulsant medications:  lamotrigine  Vitamins/Herbal/Supplements:  none  MRI of brain with and without contrast performed on 10/11/14 showed few small nonspecific hyperintensities in the frontal subcortical white matter, of uncertain significance but likely related to history of hypertension and smoking.  Caffeine:  Coffee daily Alcohol:  rarely Smoker:  yes Diet:  Trying to improve Exercise:  Not routine Depression/stress:  stable Sleep hygiene:  good Family history of headache:   No.  No family history of cerebral aneurysm. No personal history of migraines.  This week, she started having brief episodes of spinning sensation, lasting only a couple of seconds.  She noticed it while working at her desk or waking up while laying in bed.  PAST MEDICAL HISTORY: Past Medical History  Diagnosis Date  . Anxiety   . Depression   . Headache     PAST SURGICAL HISTORY: Past Surgical History  Procedure Laterality Date  . Abdominal hysterectomy    . Back surgery      MEDICATIONS: Current Outpatient Prescriptions on File Prior to Visit  Medication Sig Dispense Refill  . acetaminophen (TYLENOL) 500 MG tablet Take 1,000 mg by mouth every 6 (six) hours as needed for moderate pain.    Marland Kitchen FLUoxetine (PROZAC) 40 MG capsule Take 1 capsule (40 mg total) by mouth daily. 30 capsule 1  . losartan-hydrochlorothiazide (HYZAAR) 100-25 MG per tablet Take 1 tablet by mouth daily.    Marland Kitchen omeprazole-sodium bicarbonate (ZEGERID) 40-1100 MG per capsule Take 1 capsule by mouth.     No current facility-administered medications on file prior to visit.    ALLERGIES: Allergies  Allergen Reactions  . Lisinopril Cough  . Lisinopril Cough    FAMILY HISTORY: Family History  Problem Relation Age of Onset  . Depression Mother   . Alcohol abuse Brother   . Cancer Father     prostate   . Cancer Maternal Grandmother     stomach  . Heart disease Maternal Grandfather   . Heart failure Maternal Grandfather     SOCIAL HISTORY: Social History   Social History  . Marital Status: Married    Spouse Name: N/A  .  Number of Children: N/A  . Years of Education: N/A   Occupational History  . Not on file.   Social History Main Topics  . Smoking status: Current Every Day Smoker -- 0.25 packs/day    Types: Cigarettes  . Smokeless tobacco: Never Used     Comment: patient is aware she needs to quit   . Alcohol Use: 0.0 oz/week    0 Standard drinks or equivalent per week  . Drug Use: No  .  Sexual Activity:    Partners: Male   Other Topics Concern  . Not on file   Social History Narrative   ** Merged History Encounter **        REVIEW OF SYSTEMS: Constitutional: No fevers, chills, or sweats, no generalized fatigue, change in appetite Eyes: No visual changes, double vision, eye pain Ear, nose and throat: No hearing loss, ear pain, nasal congestion, sore throat Cardiovascular: No chest pain, palpitations Respiratory:  No shortness of breath at rest or with exertion, wheezes GastrointestinaI: No nausea, vomiting, diarrhea, abdominal pain, fecal incontinence Genitourinary:  No dysuria, urinary retention or frequency Musculoskeletal:  No neck pain, back pain Integumentary: No rash, pruritus, skin lesions Neurological: as above Psychiatric: No depression, insomnia, anxiety Endocrine: No palpitations, fatigue, diaphoresis, mood swings, change in appetite, change in weight, increased thirst Hematologic/Lymphatic:  No anemia, purpura, petechiae. Allergic/Immunologic: no itchy/runny eyes, nasal congestion, recent allergic reactions, rashes  PHYSICAL EXAM: Filed Vitals:   01/13/15 0915  BP: 128/70  Pulse: 78  Resp: 18   General: No acute distress.  Patient appears well-groomed.  Head:  Normocephalic/atraumatic Eyes:  fundi unremarkable, without vessel changes, exudates, hemorrhages or papilledema. Neck: supple, no paraspinal tenderness, full range of motion Back: No paraspinal tenderness Heart: regular rate and rhythm Lungs: Clear to auscultation bilaterally. Vascular: No carotid bruits. Neurological Exam: Mental status: alert and oriented to person, place, and time, recent and remote memory intact, fund of knowledge intact, attention and concentration intact, speech fluent and not dysarthric, language intact. Cranial nerves: CN I: not tested CN II: pupils equal, round and reactive to light, visual fields intact, fundi unremarkable, without vessel changes, exudates,  hemorrhages or papilledema. CN III, IV, VI:  full range of motion, no nystagmus, no ptosis CN V: facial sensation intact CN VII: upper and lower face symmetric CN VIII: hearing intact CN IX, X: gag intact, uvula midline CN XI: sternocleidomastoid and trapezius muscles intact CN XII: tongue midline Bulk & Tone: normal, no fasciculations. Motor:  5/5 throughout Sensation: temperature and vibration sensation intact. Deep Tendon Reflexes:  2+ throughout, toes downgoing Finger to nose testing:  Without dysmetria Heel to shin:  Without dysmetria Gait:  Normal station and stride.  Able to turn and tandem walk. Romberg negative.  IMPRESSION: Migraine without aura BPPV  PLAN: 1.  Sumatriptan 100mg  for abortive therapy 2.  She would like to hold off on a preventative at this time, since frequency has decreased.  She will call if she would like to start one.  My choice will be topiramate. 3.  Discussed smoking cessation 4.  Stop Bupap 5.  If dizzy spells continue, consider referral to vestibular rehab 6.  Follow up in 3 months.  Thank you for allowing me to take part in the care of this patient.  Shon Millet, DO  CC:  Lindaann Pascal, [place]

## 2015-01-15 ENCOUNTER — Ambulatory Visit (HOSPITAL_COMMUNITY): Payer: Self-pay | Admitting: Clinical

## 2015-01-23 ENCOUNTER — Encounter (HOSPITAL_COMMUNITY): Payer: Self-pay | Admitting: Clinical

## 2015-01-23 ENCOUNTER — Ambulatory Visit (INDEPENDENT_AMBULATORY_CARE_PROVIDER_SITE_OTHER): Payer: PRIVATE HEALTH INSURANCE | Admitting: Clinical

## 2015-01-23 DIAGNOSIS — F319 Bipolar disorder, unspecified: Secondary | ICD-10-CM

## 2015-01-23 DIAGNOSIS — F4310 Post-traumatic stress disorder, unspecified: Secondary | ICD-10-CM

## 2015-01-23 NOTE — Progress Notes (Signed)
   THERAPIST PROGRESS NOTE  Session Time: 3:20 - 4:23 Participation Level: Active  Behavioral Response: NeatAlertDepressed  Type of Therapy: Individual Therapy  Treatment Goals addressed: improve psychiatric symptoms, emotional regulation skills, , improve faulty thinking  Interventions: Motivational Interviewing, CBT, Grounding and Mindfulness Techniques   Summary: Carla Wagner is a 39 y.o. female who presents with Bipolar I and PTSD  Suicidal/Homicidal: No -without intent/plan  Therapist Response:  Carla Wagner met with clinician for an individual session. Carla Wagner discussed her psychiatric symptoms, her current life events and her homework. Any shared that she feels like her medication is working. She shared it is a subtle difference but she feel less inclined to "go off on somebody." ( meaning yell or put someone in their place) Carla Wagner shared that she has been practicing her stress management techniques. Carla Wagner shared that her favorite one is a mindfulness meditation. She shared that  About the other techniques she has used. She shared about what works for her and what does not. Carla Wagner did not bring her self esteem homework. Carla Wagner shared about some of the treatment she is receiving at work. She shared about how it reminded her of treatment she experienced earlier in life. Client and clinician discussed the events and her interpretation of the events. Carla Wagner shared and rated her emotions. She then listed her negative automatic thoughts about the events and the intentions of others. Client and clinician discussed the evidence that did and did not support her negative automatic thoughts. Carla Wagner was then able to formulate healthier alternative thoughts. Carla Wagner then rated what her emotions would be if she believed the healthier alternative thoughts. Carla Wagner rated her emotions much lower. Carla Wagner and clinician discussed the process, her thoughts and insights. Carla Wagner agreed to continue her  homework until next session.       Plan: Return again in 1 week.  Diagnosis:  Axis I: Bipolar I and PTSD    Mateya Torti A, LCSW 01/23/2015

## 2015-01-30 ENCOUNTER — Encounter (HOSPITAL_COMMUNITY): Payer: Self-pay | Admitting: Clinical

## 2015-01-30 ENCOUNTER — Ambulatory Visit (INDEPENDENT_AMBULATORY_CARE_PROVIDER_SITE_OTHER): Payer: PRIVATE HEALTH INSURANCE | Admitting: Clinical

## 2015-01-30 DIAGNOSIS — F4310 Post-traumatic stress disorder, unspecified: Secondary | ICD-10-CM | POA: Diagnosis not present

## 2015-01-30 DIAGNOSIS — F319 Bipolar disorder, unspecified: Secondary | ICD-10-CM

## 2015-01-30 NOTE — Progress Notes (Signed)
   THERAPIST PROGRESS NOTE  Session Time: 4:30 - 5:28  Participation Level: Active  Behavioral Response: NeatAlertNA   Type of Therapy: Individual Therapy  Treatment Goals addressed: improve psychiatric symptoms, emotional regulation skills, improve faulty thinking  Interventions: Motivational Interviewing, CBT, Grounding and Mindfulness Techniques   Summary: Carla Wagner is a 39 y.o. female who presents with Bipolar I and PTSD  Suicidal/Homicidal: No -without intent/plan  Therapist Response:  Arbor met with clinician for an individual session. [name] discussed her psychiatric symptoms, her current life events and her homework. [name] did not complete her packet but did practice her grounding and mindfulness techniques. [name] shared that she had just celebrated her birthday. She shared that her and her husband took a short trip for her birthday. She stated that she had arrived at the hotel when she received a call and was told that one of her cousins (who she is close to) had died. She shared that this really hurt. She shared that she felt  A lot of grief. She shared that the grief is compounded by the fact that she had begun to have conversations with her parents about how to take care of them in their older age as well as what is to be done after they die. [name] shared that she loves her parents dearly and working full time , taking care of her house and family, in addition to taking care of them has caused her a lot of stress. She shared that she also feels responsible for working out how to assist her husband's father who is also aging. [name] shared that thinking about all her responsibilities makes her head spin (racing thoughts). Client and clinician practiced a grounding technique together. Client and clinician discussed her current thought process. Client and clinician discussed how our thoughts affect our emotions and actions. Clinician asked her to make a list of her  responsibilities. Client and clinician then applied this question to each responsibility "what can I do to take care of this situation in the immediate future." when [name]'s focus moved to the far off future, she was told to make note of it but to put her focus on what she could do. [name] was able to identify what was in her power to do. She was able to formulate some options to ease her burden that she had not thought of before. [name] and clinician discussed the fact that she did have a lot on her plate, and that breaking each part down helped make it more manageable. [name] shared that she felt calmer and more confident that she could with help, handle the things before her. She shared that the grounding technique helped slow her thoughts down, which helped her calm down enough to come up with solutions. [name] agreed to continue her homework until next session.     Plan: Return again in 1 week.  Diagnosis:  Axis I: Bipolar I and PTSD   Tharon Bomar A, LCSW 01/30/2015

## 2015-02-06 ENCOUNTER — Ambulatory Visit (HOSPITAL_COMMUNITY): Payer: Self-pay | Admitting: Clinical

## 2015-02-06 ENCOUNTER — Telehealth: Payer: Self-pay | Admitting: Neurology

## 2015-02-06 NOTE — Telephone Encounter (Signed)
Pt called and asked if Dr Everlena Cooper can do accomodation papers for her job/Dawn CB# [phone]

## 2015-02-06 NOTE — Telephone Encounter (Signed)
Unable to reach patient to see exactly what type of letter she needs

## 2015-02-12 ENCOUNTER — Ambulatory Visit (HOSPITAL_COMMUNITY): Payer: Self-pay | Admitting: Psychiatry

## 2015-02-13 ENCOUNTER — Ambulatory Visit (HOSPITAL_COMMUNITY): Payer: Self-pay | Admitting: Clinical

## 2015-02-13 ENCOUNTER — Telehealth: Payer: Self-pay | Admitting: Neurology

## 2015-02-13 NOTE — Telephone Encounter (Signed)
Pt called needing information for a Job Commendation form/for Illness and Dr. Appts//call back @ [phone]

## 2015-02-14 NOTE — Telephone Encounter (Signed)
Spoke with pt. Pt. Will bring form to [place].

## 2015-02-18 ENCOUNTER — Telehealth: Payer: Self-pay | Admitting: *Deleted

## 2015-02-18 NOTE — Telephone Encounter (Signed)
Called patient to inform paperwork filled out and they would be at front desk. $25 charge informed to patient would have to be paid before forms are picked up

## 2015-02-20 ENCOUNTER — Ambulatory Visit (HOSPITAL_COMMUNITY): Payer: Self-pay | Admitting: Clinical

## 2015-02-24 ENCOUNTER — Encounter (HOSPITAL_COMMUNITY): Payer: Self-pay | Admitting: Psychiatry

## 2015-02-24 ENCOUNTER — Ambulatory Visit (INDEPENDENT_AMBULATORY_CARE_PROVIDER_SITE_OTHER): Payer: PRIVATE HEALTH INSURANCE | Admitting: Psychiatry

## 2015-02-24 VITALS — BP 128/82 | HR 84 | Ht 71.0 in | Wt 238.8 lb

## 2015-02-24 DIAGNOSIS — F1010 Alcohol abuse, uncomplicated: Secondary | ICD-10-CM | POA: Diagnosis not present

## 2015-02-24 DIAGNOSIS — F331 Major depressive disorder, recurrent, moderate: Secondary | ICD-10-CM

## 2015-02-24 MED ORDER — FLUOXETINE HCL 40 MG PO CAPS: 40.0000 mg | ORAL_CAPSULE | Freq: Every day | ORAL | Status: DC

## 2015-02-24 NOTE — Progress Notes (Signed)
[facility] [id number] Progress Note  Carla Wagner [id number] 39 y.o.  02/24/2015 3:39 PM  Chief Complaint:  I like Prozac.  My mood is much better.  I cut down the drinking.       History of Present Illness:  Carla Wagner came for her follow-up appointment.  She is taking Prozac 40 mg daily.  She denies much improvement in her mood.  She denies any irritability, anger, depressive thoughts.  She has cut down her drinking and she tried once but she had headache and since then she has not drinking.  She see neurologist recently and she is getting Imitrex as needed for headaches.  Overall she reported her mood as been stable.  She is seeing Carla Wagner for coping and social skills.  Her job is still stressful but she is handling better.  She denies any paranoia or any hallucination.  She is more hopeful denies any discouragement or disappointment.  Her sleep is good.  Her appetite is okay.  She asked question how long she has to take Prozac.  She gets time off from the work to see therapist appointment.  Patient lives with her husband and 3 children.  She is working at [employer].  Suicidal Ideation: No Plan Formed: No Patient has means to carry out plan: No  Homicidal Ideation: No Plan Formed: No Patient has means to carry out plan: No  Past Psychiatric History/Hospitalization(s) Patient completed intensive outpatient program from December 16 to January 15th, 2016.  She was given Prozac and trazodone from her primary care physician.  She was also prescribed BuSpar however she has been poorly compliant with the medication.  Patient denies any history of psychosis, aggression, violence, suicidal attempt or any inpatient psychiatric treatment. Anxiety: Yes Bipolar Disorder: History of mood swings and anger. Depression: Yes Mania: See above Psychosis: No Schizophrenia: No Personality Disorder: No Hospitalization for psychiatric illness: No History of Electroconvulsive Shock Therapy: No Prior Suicide  Attempts: No  Medical History; Patient has history of back surgery, hypertension and headaches.  Physician is Dr. Harrietta Guardian at [facility].  Her last blood work was on 04/29/2014 .  Patient denies any history of seizures.    Review of Systems  Constitutional: Negative.   HENT: Negative for hearing loss.   Cardiovascular: Negative for chest pain and palpitations.  Skin: Negative.   Neurological: Negative for dizziness, tremors and headaches.  Psychiatric/Behavioral: Negative for suicidal ideas.    Psychiatric: Agitation: No Hallucination: No Depressed Mood: No Insomnia: No Hypersomnia: No Altered Concentration: No Feels Worthless: No Grandiose Ideas: No Belief In Special Powers: No New/Increased Substance Abuse: No Compulsions: No  Neurologic: Headache: No Seizure: No Paresthesias: No   Musculoskeletal: Strength & Muscle Tone: within normal limits Gait & Station: normal Patient leans: N/A   Outpatient Encounter Prescriptions as of 02/24/2015  Medication Sig  . acetaminophen (TYLENOL) 500 MG tablet Take 1,000 mg by mouth every 6 (six) hours as needed for moderate pain.  Marland Kitchen FLUoxetine (PROZAC) 40 MG capsule Take 1 capsule (40 mg total) by mouth daily.  Marland Kitchen losartan-hydrochlorothiazide (HYZAAR) 100-25 MG per tablet Take 1 tablet by mouth daily.  Marland Kitchen omeprazole-sodium bicarbonate (ZEGERID) 40-1100 MG per capsule Take 1 capsule by mouth.  . SUMAtriptan (IMITREX) 100 MG tablet Take 1tab at earliest onset of headache.  May repeat x1 in 2 hours if headache persists or recurs.  Do not exceed 2tabs in 24hrs.  . [DISCONTINUED] FLUoxetine (PROZAC) 40 MG capsule Take 1 capsule (40 mg total)  by mouth daily.   No facility-administered encounter medications on file as of 02/24/2015.    No results found for this or any previous visit (from the past 2160 hour(s)).    Constitutional:  BP 128/82 mmHg  Pulse 84  Ht  (1.803 m)  Wt 238 lb 12.8 oz (108.319 kg)   BMI 33.32 kg/m2  LMP  (Exact Date)   Mental Status Examination;  Patient is casually dressed and fairly groomed.  She is pleasant and cooperative.  She maintained good eye contact.  She described her mood good and her affect is improved from the past.  Her speech is fluent, clear and coherent.  She denies any active or passive suicidal thoughts or homicidal thought.  She denies any auditory or visual hallucination.  Her psychomotor activity is normal.  Her thought process is logical and goal-directed.  Her attention and concentration is okay.  There were no delusions, paranoia or any obsessive thoughts.  There were no tremors or shakes.  She is alert and oriented 3.  Her insight judgment and impulse control is okay.   Established Problem, Stable/Improving (1), Review of Psycho-Social Stressors (1), Review of Last Therapy Session (1) and Review of Medication Regimen & Side Effects (2)  Assessment: Axis I: Major depressive disorder, recurrent moderate.  Alcohol abuse.  Axis II: Deferred  Axis III:  Past Medical History  Diagnosis Date  . Anxiety   . Depression   . Headache      Plan:  Patient is doing better since taking Prozac 40 mg daily.  She cut down her drinking.  Her headaches are also less intense and less frequent.  She see neurologist.  She asked question about long-term prognosis and how long she need to take the Prozac.  We talked about risk and benefits of medication.  She decided to continue Prozac for at least another 3 months and at that time we will review if she like to cut down the dosage as patient like to come off from the medication in the future.  At this time he does not have any side effects.  Encouraged to continue Prozac 40 mg daily to prevent any relapse into her illness.  Continue to see Tomma Lightning for coping and social skills.  Recommended to call us back if she has any question or any concern.  Follow-up in 3 months.  Carla Tramontana T.,  MD 02/24/2015

## 2015-02-28 ENCOUNTER — Telehealth (HOSPITAL_COMMUNITY): Payer: Self-pay

## 2015-02-28 DIAGNOSIS — F331 Major depressive disorder, recurrent, moderate: Secondary | ICD-10-CM

## 2015-02-28 NOTE — Telephone Encounter (Signed)
Medication refill fax received for patient's prescribed Fluoxetine as reports they must do 90 day orders if approved due to patient insurance coverage

## 2015-03-03 MED ORDER — FLUOXETINE HCL 40 MG PO CAPS: 40.0000 mg | ORAL_CAPSULE | Freq: Every day | ORAL | Status: DC

## 2015-03-03 NOTE — Telephone Encounter (Signed)
Met with Dr. Adele Schilder who approved a 90 day order of patient's prescribed Fluoxetine 55m and new order e-scribed to patient's [facility] on [address] take the place of 30 day orders sent 02/24/15 due to what insurance would cover.

## 2015-03-03 NOTE — Addendum Note (Signed)
Addended by: Wilder Glade on: 03/03/2015 03:06 PM   Modules accepted: Orders

## 2015-04-16 ENCOUNTER — Ambulatory Visit: Payer: Self-pay | Admitting: Neurology

## 2015-05-09 ENCOUNTER — Ambulatory Visit: Payer: Self-pay | Admitting: Neurology

## 2015-05-09 ENCOUNTER — Telehealth: Payer: Self-pay

## 2015-05-09 NOTE — Telephone Encounter (Signed)
Left message on VM at pt's request.

## 2015-05-09 NOTE — Telephone Encounter (Signed)
She no-showed her appointment for today.  She needs to follow up before any forms will be filled out.

## 2015-05-09 NOTE — Telephone Encounter (Signed)
Patient called about FMLA paperwork filled out in September. Patient was not FMLA eligible at that time. Patient is now FMLA eligible and would like date on form to reflect this new eligibility. Okay to drop off new form for completion? (I will transfer data from old form to new). Please advise.

## 2015-05-12 ENCOUNTER — Ambulatory Visit: Payer: Self-pay | Admitting: Neurology

## 2015-06-02 ENCOUNTER — Ambulatory Visit (HOSPITAL_COMMUNITY): Payer: Self-pay | Admitting: Psychiatry

## 2015-06-17 ENCOUNTER — Ambulatory Visit (INDEPENDENT_AMBULATORY_CARE_PROVIDER_SITE_OTHER): Payer: BLUE CROSS/BLUE SHIELD | Admitting: Neurology

## 2015-06-17 ENCOUNTER — Encounter: Payer: Self-pay | Admitting: Neurology

## 2015-06-17 VITALS — BP 120/68 | HR 72 | Ht 71.0 in | Wt 233.0 lb

## 2015-06-17 DIAGNOSIS — Z720 Tobacco use: Secondary | ICD-10-CM

## 2015-06-17 DIAGNOSIS — R51 Headache: Secondary | ICD-10-CM

## 2015-06-17 DIAGNOSIS — R519 Headache, unspecified: Secondary | ICD-10-CM

## 2015-06-17 MED ORDER — NORTRIPTYLINE HCL 10 MG PO CAPS: 10.0000 mg | ORAL_CAPSULE | Freq: Every day | ORAL | Status: DC

## 2015-06-17 NOTE — Progress Notes (Signed)
Chart forwarded.  

## 2015-06-17 NOTE — Patient Instructions (Signed)
1.  Start nortriptyline  at bedtime.  Keep a headache diary.  If headaches not improved in 4 weeks, call us and we can increase dose if needed 2.  Use over the counter pain relievers but limit to no more than 2 days out of the week 3.  Follow up in 5 months.

## 2015-06-17 NOTE — Progress Notes (Signed)
NEUROLOGY FOLLOW UP OFFICE NOTE  Lyncoln Maskell [id number]  HISTORY OF PRESENT ILLNESS: Carla Wagner is a 40 year old right-handed female with tobacco abuse, depression and hypertension who follows up for migraine.    UPDATE: She quit her job in December and her migraines have resolved.  However, she reports a different type of headache.  She reports bilateral shooting pain from both temples to the back of the head.  The shooting pain is brief.  She has throbbing temporal pain that lasts a couple of hours.  She denies associated symptoms such as nausea.  She denies neck pain.  HISTORY: Onset:  February 2016, hit the right side of back of head on headboard.  No loss of consciousness. Location:  Starts in back of head on right side, up the right side of head and across the front. Quality:  Aching in back of head, shooting over the right side of head, throbbing of the forehead Initial Intensity:  8-9/10 Aura:  no Prodrome:  no Associated symptoms:  Nausea, photophobia, phonophobia, lightheadedness.  No vomiting or visual disturbance. Initial Duration:  2-3 days Initial Frequency:  2 to 4 times a month (approximately 15 headache days total per month) Triggers/exacerbating factors:  Light, walking around Relieving factors:  sleep Activity:  Needs to lay down  Past abortive medication:  Tylenol, Bupap, Norco.  Unable to take NSAIDs due to upset stomach. Past preventative medication:  none Other past therapy:  none  MRI of brain with and without contrast performed on 10/11/14 showed few small nonspecific hyperintensities in the frontal subcortical white matter, of uncertain significance but likely related to history of hypertension and smoking.  Family history of headache:  No.  No family history of cerebral aneurysm. No personal history of migraines.  PAST MEDICAL HISTORY: Past Medical History  Diagnosis Date  . Anxiety   . Depression   . Headache     MEDICATIONS: Current  Outpatient Prescriptions on File Prior to Visit  Medication Sig Dispense Refill  . acetaminophen (TYLENOL) 500 MG tablet Take 1,000 mg by mouth every 6 (six) hours as needed for moderate pain.    Marland Kitchen losartan-hydrochlorothiazide (HYZAAR) 100-25 MG per tablet Take 1 tablet by mouth daily.    Marland Kitchen omeprazole-sodium bicarbonate (ZEGERID) 40-1100 MG per capsule Take 1 capsule by mouth.     No current facility-administered medications on file prior to visit.    ALLERGIES: Allergies  Allergen Reactions  . Lisinopril Cough  . Lisinopril Cough    FAMILY HISTORY: Family History  Problem Relation Age of Onset  . Depression Mother   . Alcohol abuse Brother   . Cancer Father     prostate   . Cancer Maternal Grandmother     stomach  . Heart disease Maternal Grandfather   . Heart failure Maternal Grandfather     SOCIAL HISTORY: Social History   Social History  . Marital Status: Married    Spouse Name: N/A  . Number of Children: N/A  . Years of Education: N/A   Occupational History  . Not on file.   Social History Main Topics  . Smoking status: Current Every Day Smoker -- 0.25 packs/day    Types: Cigarettes  . Smokeless tobacco: Never Used     Comment: patient is aware she needs to quit   . Alcohol Use: 0.0 oz/week    0 Standard drinks or equivalent per week  . Drug Use: No  . Sexual Activity:    Partners: Male  Other Topics Concern  . Not on file   Social History Narrative   ** Merged History Encounter **        REVIEW OF SYSTEMS: Constitutional: No fevers, chills, or sweats, no generalized fatigue, change in appetite Eyes: No visual changes, double vision, eye pain Ear, nose and throat: No hearing loss, ear pain, nasal congestion, sore throat Cardiovascular: No chest pain, palpitations Respiratory:  No shortness of breath at rest or with exertion, wheezes GastrointestinaI: No nausea, vomiting, diarrhea, abdominal pain, fecal incontinence Genitourinary:  No dysuria,  urinary retention or frequency Musculoskeletal:  No neck pain, back pain Integumentary: No rash, pruritus, skin lesions Neurological: as above Psychiatric: No depression, insomnia, anxiety Endocrine: No palpitations, fatigue, diaphoresis, mood swings, change in appetite, change in weight, increased thirst Hematologic/Lymphatic:  No anemia, purpura, petechiae. Allergic/Immunologic: no itchy/runny eyes, nasal congestion, recent allergic reactions, rashes  PHYSICAL EXAM: Filed Vitals:   06/17/15 1334  BP: 120/68  Pulse: 72   General: No acute distress.  Patient appears well-groomed.   Head:  Normocephalic/atraumatic Eyes:  Fundoscopic exam unremarkable without vessel changes, exudates, hemorrhages or papilledema. Neck: supple, no paraspinal tenderness, full range of motion Heart:  Regular rate and rhythm Lungs:  Clear to auscultation bilaterally Back: No paraspinal tenderness Neurological Exam: alert and oriented to person, place, and time. Attention span and concentration intact, recent and remote memory intact, fund of knowledge intact.  Speech fluent and not dysarthric, language intact.  CN II-XII intact. Fundoscopic exam unremarkable without vessel changes, exudates, hemorrhages or papilledema.  Bulk and tone normal, muscle strength 5/5 throughout.  Sensation to light touch, temperature and vibration intact.  Deep tendon reflexes 2+ throughout, toes downgoing.  Finger to nose and heel to shin testing intact.  Gait normal, Romberg negative.  IMPRESSION: Headache.  She denies neck pain or suboccipital tenderness, but cervicogenic may be possible. Tobacco dependence  PLAN: Nortriptyline  at bedtime.  She is to call in 4 weeks with update. OTC analgesics as needed (limited to no more than 2 days out of the week) Smoking cessation Follow up in 5 months.  15 minutes spent face to face with patient, over 50% spent discussing management.  Shon Millet, DO  CC:  Lindaann Pascal,  [place]

## 2015-06-30 ENCOUNTER — Encounter: Payer: Self-pay | Admitting: Neurology

## 2015-07-16 IMAGING — CR DG LUMBAR SPINE 2-3V
3 series · 3 of 3 positions shown · non-contrast
Comparison: None.

CLINICAL DATA: Low back pain. Left-sided pain.

EXAM:
LUMBAR SPINE - 2-3 VIEW

[view not recorded (1 of 3)]
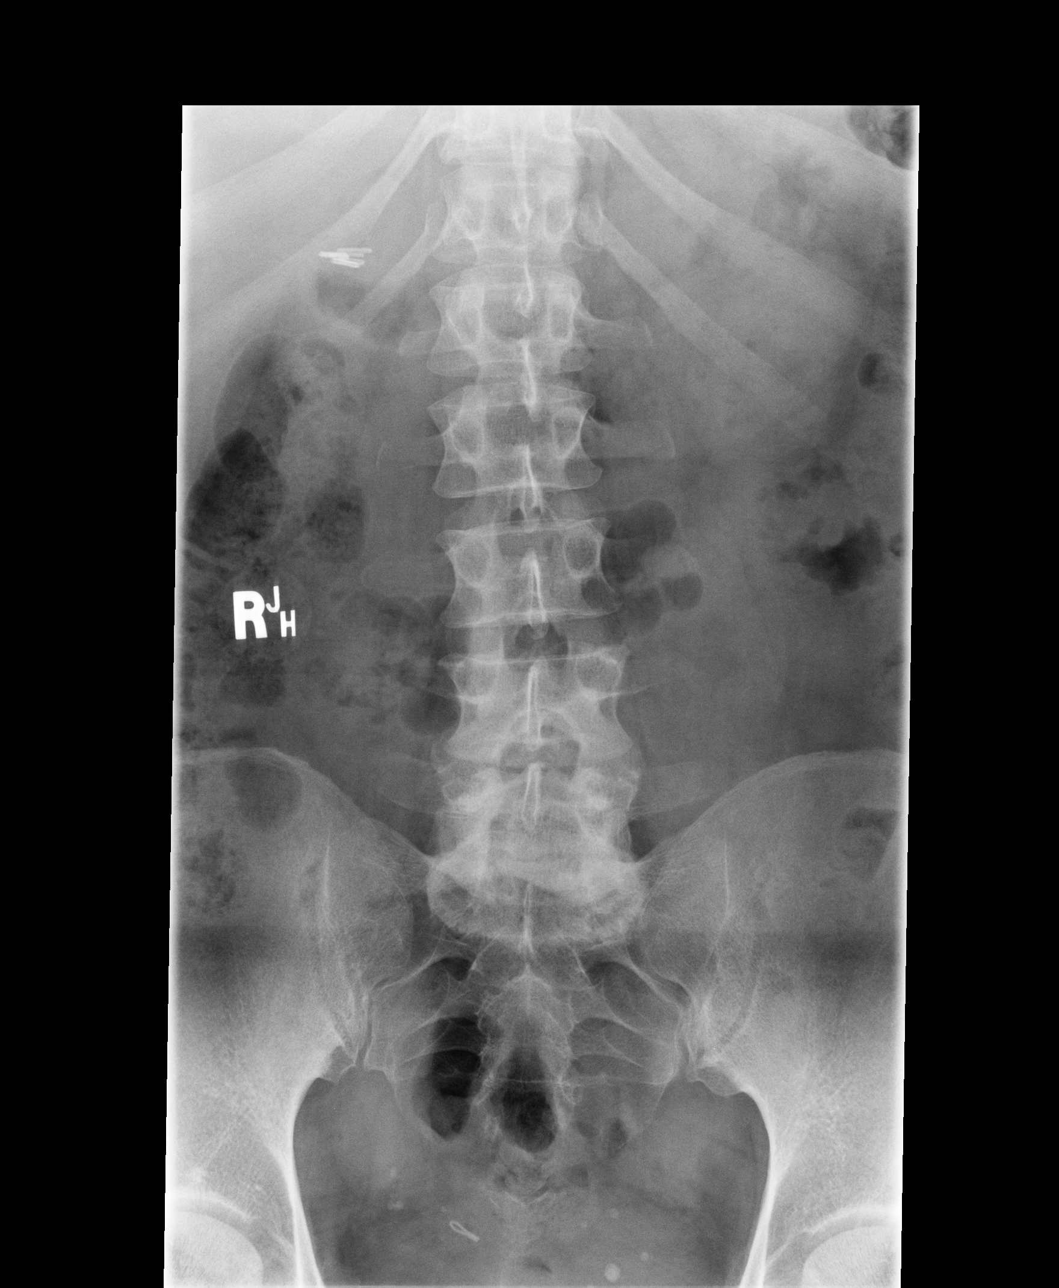

[view not recorded (2 of 3)]
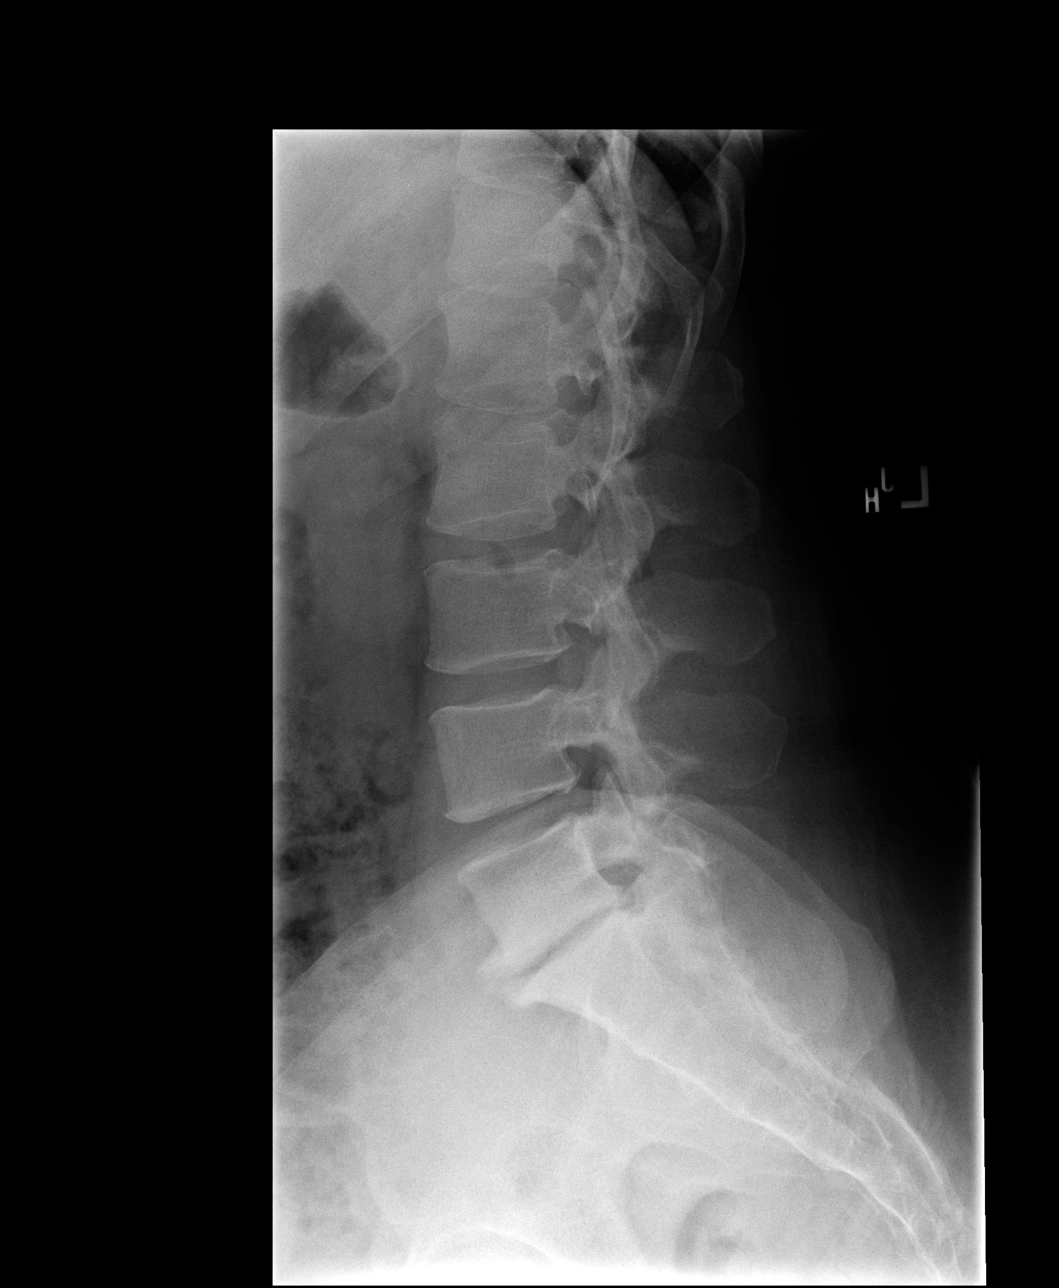

[view not recorded (3 of 3)]
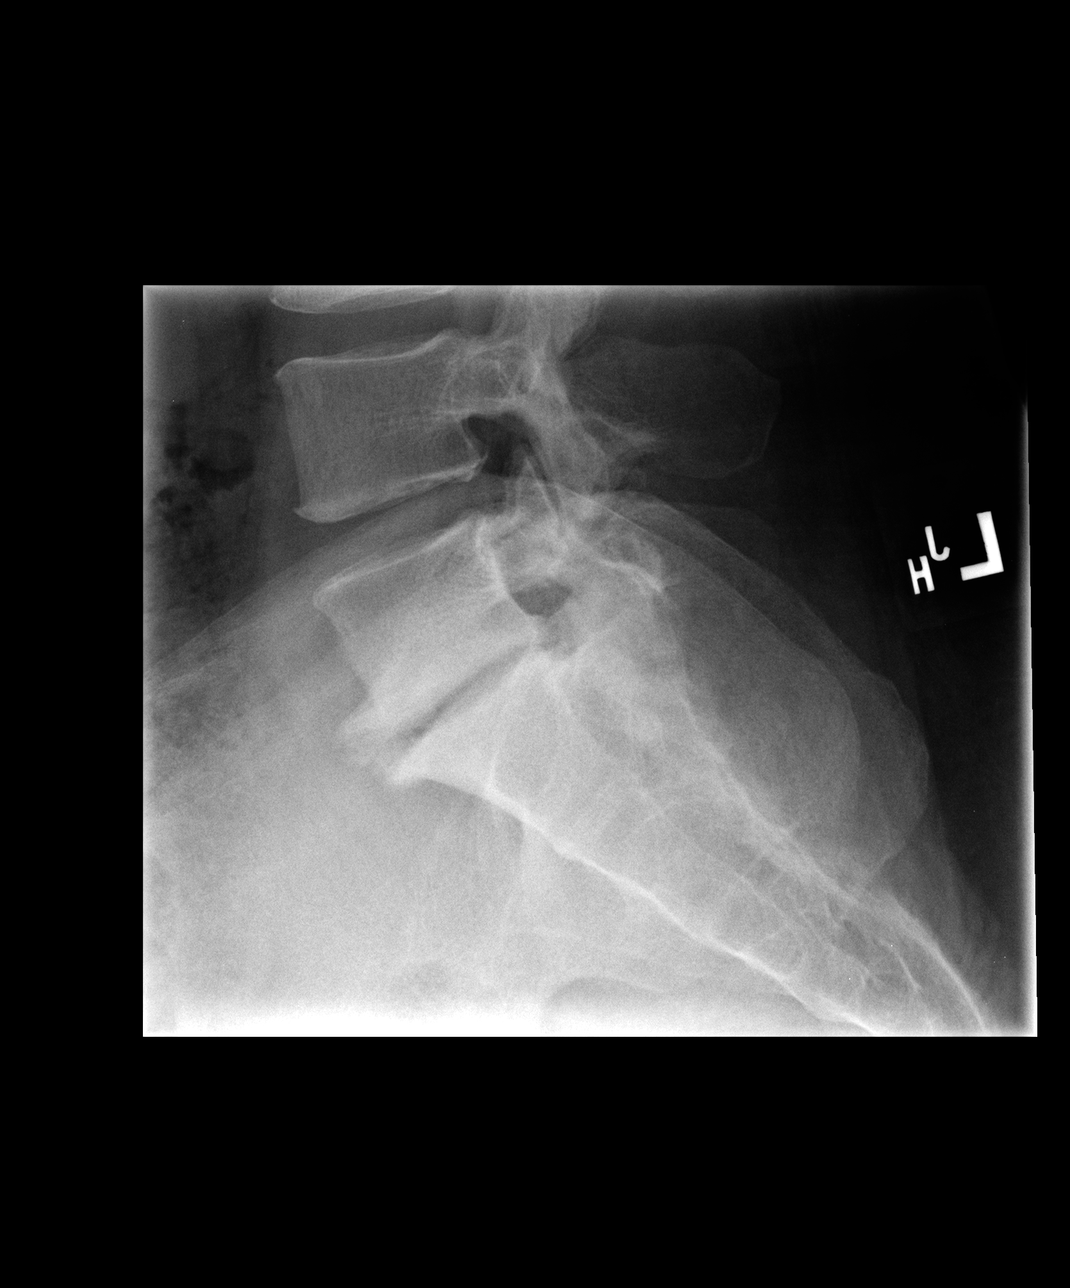

[3 of 3 positions shown; findings below may reference images not displayed]

FINDINGS: There is a mild levo convex curve with the apex at L4-L5. grade I
retrolisthesis of L4 on L5 is present which is probably
degenerative. Mild disc space loss at L4-L5 with severe disc space
loss at L5-S1. Surgical clip in the anatomic pelvis may represent
dropped clip. Cholecystectomy clips are present in the right upper
quadrant.
IMPRESSION: Moderate lumbar spondylosis, most pronounced at L5-S1.

## 2015-07-24 ENCOUNTER — Other Ambulatory Visit: Payer: Self-pay

## 2015-07-24 MED ORDER — NORTRIPTYLINE HCL 10 MG PO CAPS: 10.0000 mg | ORAL_CAPSULE | Freq: Every day | ORAL | Status: DC

## 2015-11-17 ENCOUNTER — Encounter: Payer: Self-pay | Admitting: Neurology

## 2015-11-17 ENCOUNTER — Ambulatory Visit (INDEPENDENT_AMBULATORY_CARE_PROVIDER_SITE_OTHER): Payer: BLUE CROSS/BLUE SHIELD | Admitting: Neurology

## 2015-11-17 VITALS — BP 120/68 | HR 82 | Ht 71.0 in | Wt 243.0 lb

## 2015-11-17 DIAGNOSIS — G43009 Migraine without aura, not intractable, without status migrainosus: Secondary | ICD-10-CM

## 2015-11-17 DIAGNOSIS — Z720 Tobacco use: Secondary | ICD-10-CM | POA: Diagnosis not present

## 2015-11-17 MED ORDER — RIZATRIPTAN BENZOATE 10 MG PO TABS: ORAL_TABLET | ORAL | Status: DC

## 2015-11-17 MED ORDER — NORTRIPTYLINE HCL 10 MG PO CAPS: 10.0000 mg | ORAL_CAPSULE | Freq: Every day | ORAL | Status: DC

## 2015-11-17 MED ORDER — PROMETHAZINE HCL 12.5 MG PO TABS: 12.5000 mg | ORAL_TABLET | Freq: Four times a day (QID) | ORAL | Status: DC | PRN

## 2015-11-17 NOTE — Patient Instructions (Signed)
Migraine Recommendations: 1.  Start nortriptyline 10mg  at bedtie.  Call in 4 weeks with update and we can adjust dose if needed. 2.  Take rizatriptan 10mg  at earliest onset of headache.  May repeat dose once in 2 hours if needed.  Do not exceed two hours in 24 hours.  For nausea, use promethazine 12.5mg  every 6 hours as needed. 3.  Limit use of pain relievers to no more than 2 days out of the week.  These medications include acetaminophen, ibuprofen, triptans and narcotics.  This will help reduce risk of rebound headaches. 4.  Be aware of common food triggers such as processed sweets, processed foods with nitrites (such as deli meat, hot dogs, sausages), foods with MSG, alcohol (such as wine), chocolate, certain cheeses, certain fruits (dried fruits, some citrus fruit), vinegar, diet soda. 4.  Avoid caffeine 5.  Routine exercise 6.  Proper sleep hygiene 7.  Stay adequately hydrated with water 8.  Keep a headache diary. 9.  Maintain proper stress management. 10.  Do not skip meals. 11.  Consider supplements:  Magnesium citrate 400mg  to 600mg  daily, riboflavin 400mg , Coenzyme Q 10 100mg  three times daily 12.  CONTACT US IN 4 WEEKS WITH UPDATE 13.  Follow up in 6 months.

## 2015-11-17 NOTE — Progress Notes (Signed)
NEUROLOGY FOLLOW UP OFFICE NOTE  Carla Wagner [id number]  HISTORY OF PRESENT ILLNESS: Carla Wagner is a 40 year old right-handed female with tobacco abuse, depression and hypertension who follows up for migraine.    UPDATE: Intensity: 7-8/10 Duration:  With Excedrin Migraine, edge is taken off in about 30-45 minutes and then she lays down, but not aborted. Frequency:  About 7 days per month, usually in clusters Current abortive medication:  Excedrin migraine Current preventative:  None. She tried nortriptyline for a couple of days and then stopped.  HISTORY: Onset:  February 2016, hit the right side of back of head on headboard.  No loss of consciousness. Location:  Starts in back of head on right side, up the right side of head and across the front. Quality:  Aching in back of head, shooting over the right side of head, throbbing of the forehead Initial Intensity:  8-9/10 Aura:  no Prodrome:  no Associated symptoms:  Nausea, photophobia, phonophobia, lightheadedness.  No vomiting or visual disturbance. Initial Duration:  2-3 days Initial Frequency:  2 to 4 times a month (approximately 15 headache days total per month) Triggers/exacerbating factors:  Light, walking around Relieving factors:  sleep Activity:  Needs to lay down  Past abortive medication:  Tylenol, Bupap, Norco.  Sumatriptan (thinks she may have had a side effect but not anaphylaxis).  Unable to take NSAIDs due to upset stomach. Past preventative medication:  none Other past therapy:  none  MRI of brain with and without contrast performed on 10/11/14 showed few small nonspecific hyperintensities in the frontal subcortical white matter, of uncertain significance but likely related to history of hypertension and smoking.  Family history of headache:  No.  No family history of cerebral aneurysm. No personal history of migraines.  PAST MEDICAL HISTORY: Past Medical History  Diagnosis Date  . Anxiety   .  Depression   . Headache     MEDICATIONS: Current Outpatient Prescriptions on File Prior to Visit  Medication Sig Dispense Refill  . acetaminophen (TYLENOL) 500 MG tablet Take 1,000 mg by mouth every 6 (six) hours as needed for moderate pain.    Marland Kitchen. losartan-hydrochlorothiazide (HYZAAR) 100-25 MG per tablet Take 1 tablet by mouth daily.    Marland Kitchen. omeprazole-sodium bicarbonate (ZEGERID) 40-1100 MG per capsule Take 1 capsule by mouth.     No current facility-administered medications on file prior to visit.    ALLERGIES: Allergies  Allergen Reactions  . Lisinopril Cough  . Lisinopril Cough    FAMILY HISTORY: Family History  Problem Relation Age of Onset  . Depression Mother   . Alcohol abuse Brother   . Cancer Father     prostate   . Cancer Maternal Grandmother     stomach  . Heart disease Maternal Grandfather   . Heart failure Maternal Grandfather     SOCIAL HISTORY: Social History   Social History  . Marital Status: Married    Spouse Name: N/A  . Number of Children: N/A  . Years of Education: N/A   Occupational History  . Not on file.   Social History Main Topics  . Smoking status: Current Every Day Smoker -- 0.25 packs/day    Types: Cigarettes  . Smokeless tobacco: Never Used     Comment: patient is aware she needs to quit   . Alcohol Use: 0.0 oz/week    0 Standard drinks or equivalent per week  . Drug Use: No  . Sexual Activity:    Partners: Male  Other Topics Concern  . Not on file   Social History Narrative   ** Merged History Encounter **        REVIEW OF SYSTEMS: Constitutional: No fevers, chills, or sweats, no generalized fatigue, change in appetite Eyes: No visual changes, double vision, eye pain Ear, nose and throat: No hearing loss, ear pain, nasal congestion, sore throat Cardiovascular: No chest pain, palpitations Respiratory:  No shortness of breath at rest or with exertion, wheezes GastrointestinaI: No nausea, vomiting, diarrhea, abdominal  pain, fecal incontinence Genitourinary:  No dysuria, urinary retention or frequency Musculoskeletal:  No neck pain, back pain Integumentary: No rash, pruritus, skin lesions Neurological: as above Psychiatric: No depression, insomnia, anxiety Endocrine: No palpitations, fatigue, diaphoresis, mood swings, change in appetite, change in weight, increased thirst Hematologic/Lymphatic:  No purpura, petechiae. Allergic/Immunologic: no itchy/runny eyes, nasal congestion, recent allergic reactions, rashes  PHYSICAL EXAM: Filed Vitals:   11/17/15 1358  BP: 120/68  Pulse: 82   General: No acute distress.  Patient appears well-groomed.   Head:  Normocephalic/atraumatic Eyes:  Fundi examined but not visualized Neck: supple, no paraspinal tenderness, full range of motion Heart:  Regular rate and rhythm Lungs:  Clear to auscultation bilaterally Back: No paraspinal tenderness Neurological Exam: alert and oriented to person, place, and time. Attention span and concentration intact, recent and remote memory intact, fund of knowledge intact.  Speech fluent and not dysarthric, language intact.  CN II-XII intact. Bulk and tone normal, muscle strength 5/5 throughout.  Sensation to light touch, temperature and vibration intact.  Deep tendon reflexes 2+ throughout, toes downgoing.  Finger to nose and heel to shin testing intact.  Gait normal.  IMPRESSION: Migraine without aura  PLAN: 1.  She would like to restart nortriptyline 10mg  at bedtime 2.  Maxalt 10mg  for abortive therapy 3.  Follow up in 6 months but contact us in 4 weeks with update 4.  Smoking cessation  15 minutes spent face to face with patient, over 50% spent discussing management.  Shon MilletAdam Burnette Sautter, DO  CC:  Lindaann PascalScott Long

## 2015-11-18 NOTE — Progress Notes (Signed)
Chart forwarded.  

## 2016-04-01 ENCOUNTER — Telehealth (HOSPITAL_COMMUNITY): Payer: Self-pay

## 2016-04-01 NOTE — Telephone Encounter (Signed)
L/M to call front office to schedule an appt. PT is est w/Dr. Lolly MustacheArfeen

## 2016-04-08 ENCOUNTER — Telehealth: Payer: Self-pay | Admitting: Neurology

## 2016-04-08 NOTE — Telephone Encounter (Signed)
Patient phone  [phone]  Patient states that she picked up records and wants to talk to someone about a medication he took her off. She needs some kind of letter of why he took her off  Please call her

## 2016-04-08 NOTE — Telephone Encounter (Signed)
Left message on pt's vm to return call to office.

## 2016-04-08 NOTE — Telephone Encounter (Signed)
Pt requesting letter about why Bupap was d/c. Letter was written signed by provider and placed at front desk for pickup pt aware.

## 2016-04-21 ENCOUNTER — Encounter: Payer: Self-pay | Admitting: Physician Assistant

## 2016-04-28 ENCOUNTER — Encounter: Payer: Self-pay | Admitting: Physician Assistant

## 2016-04-28 ENCOUNTER — Ambulatory Visit (INDEPENDENT_AMBULATORY_CARE_PROVIDER_SITE_OTHER): Payer: Managed Care, Other (non HMO) | Admitting: Physician Assistant

## 2016-04-28 VITALS — BP 102/68 | HR 68 | Ht 71.0 in | Wt 243.8 lb

## 2016-04-28 DIAGNOSIS — R1319 Other dysphagia: Secondary | ICD-10-CM

## 2016-04-28 DIAGNOSIS — K219 Gastro-esophageal reflux disease without esophagitis: Secondary | ICD-10-CM

## 2016-04-28 MED ORDER — OMEPRAZOLE-SODIUM BICARBONATE 40-1100 MG PO CAPS: 1.0000 | ORAL_CAPSULE | Freq: Every day | ORAL | 11 refills | Status: DC

## 2016-04-28 NOTE — Progress Notes (Signed)
Subjective:    Patient ID: Carla Wagner, female    DOB: 01/31/1976, 40 y.o.   MRN: [id number]  HPI Carla Wagner is a pleasant 40 year old African-American female, new to GI [facility]C. She has had problems with chronic constipation intermittent left-sided abdominal discomfort and GERD. Other medical issues include hypertension, depression, migraines, PTSD and bipolar disorder. She is status post laparoscopic cholecystectomy in 2010. She relates that she did have a previous GI workup by Dr. Elnoria HowardHung but believes this was 10-15 years ago. She's not sure she had endoscopy and colonoscopy or not. She says she has had constipation as far back as she can remember early does fairly well with the combination of Maalox and stool softeners. She also feels that Zegerid which she is taking regularly for reflux seems to help her have bowel movements. She has not noticed any rectal bleeding. She says very sporadically she will get a pain in her left abdomen which she has attributed to spasm. Started about need for regular Zegerid dosing. She says if she takes it everyday she does find that if she misses even one dose she'll have heartburn and indigestion. She also has intermittent solid food dysphagia. She has had reflux symptoms for several years and has been on Zegerid for several years. Sliding Family history is negative for colon cancer polyps as far she is aware. Recent labs done 03/30/2016 with UA negative, liver function studies normal and CBC within normal limits.  Review of Systems Pertinent positive and negative review of systems were noted in the above HPI section.  All other review of systems was otherwise negative.  Outpatient Encounter Prescriptions as of 04/28/2016  Medication Sig  . acetaminophen (TYLENOL) 500 MG tablet Take 1,000 mg by mouth every 6 (six) hours as needed for moderate pain.  Marland Kitchen. losartan-hydrochlorothiazide (HYZAAR) 100-25 MG per tablet Take 1 tablet by mouth daily.    Marland Kitchen. omeprazole-sodium bicarbonate (ZEGERID) 40-1100 MG per capsule Take 1 capsule by mouth.  . rizatriptan (MAXALT) 10 MG tablet Take 1 at earliest onset of headache.  May repeat x1 in 2 hours if needed  . Vitamin D, Ergocalciferol, (DRISDOL) 50000 units CAPS capsule Take 1 capsule by mouth once a week.  Marland Kitchen. omeprazole-sodium bicarbonate (ZEGERID) 40-1100 MG capsule Take 1 capsule by mouth daily before breakfast.  . [DISCONTINUED] aspirin-acetaminophen-caffeine (EXCEDRIN MIGRAINE) 250-250-65 MG tablet Take by mouth every 6 (six) hours as needed for headache.  . [DISCONTINUED] nortriptyline (PAMELOR) 10 MG capsule Take 1 capsule (10 mg total) by mouth at bedtime.  . [DISCONTINUED] promethazine (PHENERGAN) 12.5 MG tablet Take 1 tablet (12.5 mg total) by mouth every 6 (six) hours as needed for nausea or vomiting.   No facility-administered encounter medications on file as of 04/28/2016.    Allergies  Allergen Reactions  . Lisinopril Cough  . Lisinopril Cough   Patient Active Problem List   Diagnosis Date Noted  . Intractable migraine without aura and without status migrainosus 01/13/2015  . BPPV (benign paroxysmal positional vertigo) 01/13/2015  . Essential hypertension 01/13/2015  . Tobacco abuse 01/13/2015  . Depression 01/13/2015   Social History   Social History  . Marital status: Married    Spouse name: N/A  . Number of children: 3  . Years of education: N/A   Occupational History  . Not on file.   Social History Main Topics  . Smoking status: Current Every Day Smoker    Packs/day: 0.25    Types: Cigarettes  . Smokeless tobacco:  Never Used     Comment: patient is aware she needs to quit   . Alcohol use 0.0 oz/week  . Drug use: No  . Sexual activity: Yes    Partners: Male   Other Topics Concern  . Not on file   Social History Narrative   ** Merged History Encounter **        [name]'s family history includes Alcohol abuse in her brother; Cancer in her father and  maternal grandmother; Depression in her mother; Heart disease in her maternal grandfather; Heart failure in her maternal grandfather.      Objective:    Vitals:   04/28/16 1021  BP: 102/68  Pulse: 68    Physical Exam well-developed African-American female in no acute distress, pleasant blood pressure 102/68 pulse 68 BMI 34 height 5 foot 11 weight 243. HEENT; nontraumatic normocephalic EOMI PERRLA sclera anicteric, Cardiovascular; regular rate and rhythm with S1-S2 no murmur or gallop, Pulmonary ;clear bilaterally, Abdomen ;soft basically nontender there is no palpable mass or hepatosplenomegaly bowel sounds are present, Rectal ;exam not done, Extremities; no clubbing cyanosis or edema skin warm and dry, Neuropsych; mood and affect appropriate       Assessment & Plan:   #751 40 year old African-American female with chronic constipation-currently managing well with Maalox and when necessary stool softeners #2 chronic GERD-patient is been symptomatic for several years and has required PPI therapy for several years. She also complains of intermittent solid food dysphagia raising concern for stricture or ring. #3 status post laparoscopic cholecystectomy 2010  Plan; Start anti-reflux regimen, and antireflux diet, also advised weight loss to help with management of reflux symptoms. Continue Zegerid 40/1100 one by mouth every morning Start daily probiotic, suggested align or culture L on a daily basis Patient will be scheduled for EGD with possible dilation with Dr. Christella HartiganJacobs. Procedure discussed in detail with patient including risks and benefits and she is agreeable to proceed. We have requested her records from Dr. Haywood PaoHung's office.  Carla Wagner S Chrystopher Stangl PA-C 04/28/2016   Cc: Lindaann PascalLong, Scott, PA-C

## 2016-04-28 NOTE — Patient Instructions (Signed)
We sent prescriptions to [facility] [address]. 1. Zegerid 40 /1100  Take a probiotic, 1 capsule daily for now. We suggest Align or Culturelle. You can get these at your pharmacy or [facility].   You have been scheduled for an endoscopy. Please follow written instructions given to you at your visit today. If you use inhalers (even only as needed), please bring them with you on the day of your procedure. Your physician has requested that you go to www.startemmi.com and enter the access code given to you at your visit today. This web site gives a general overview about your procedure. However, you should still follow specific instructions given to you by our office regarding your preparation for the procedure.

## 2016-04-29 NOTE — Progress Notes (Signed)
I agree with the above note, plan 

## 2016-05-03 ENCOUNTER — Encounter: Payer: Self-pay | Admitting: Gastroenterology

## 2016-05-03 ENCOUNTER — Ambulatory Visit (AMBULATORY_SURGERY_CENTER): Payer: Managed Care, Other (non HMO) | Admitting: Gastroenterology

## 2016-05-03 VITALS — BP 119/74 | HR 78 | Temp 97.7°F | Resp 10 | Ht 71.0 in | Wt 243.0 lb

## 2016-05-03 DIAGNOSIS — K222 Esophageal obstruction: Secondary | ICD-10-CM

## 2016-05-03 DIAGNOSIS — R1310 Dysphagia, unspecified: Secondary | ICD-10-CM

## 2016-05-03 DIAGNOSIS — R1319 Other dysphagia: Secondary | ICD-10-CM

## 2016-05-03 DIAGNOSIS — K219 Gastro-esophageal reflux disease without esophagitis: Secondary | ICD-10-CM

## 2016-05-03 MED ORDER — SODIUM CHLORIDE 0.9 % IV SOLN: 500.0000 mL | INTRAVENOUS | Status: DC

## 2016-05-03 NOTE — Patient Instructions (Signed)
Discharge instructions given. Handout on a dilatation diet. Resume previous medications. YOU HAD AN ENDOSCOPIC PROCEDURE TODAY AT THE [facility]:   Refer to the procedure report that was given to you for any specific questions about what was found during the examination.  If the procedure report does not answer your questions, please call your gastroenterologist to clarify.  If you requested that your care partner not be given the details of your procedure findings, then the procedure report has been included in a sealed envelope for you to review at your convenience later.  YOU SHOULD EXPECT: Some feelings of bloating in the abdomen. Passage of more gas than usual.  Walking can help get rid of the air that was put into your GI tract during the procedure and reduce the bloating. If you had a lower endoscopy (such as a colonoscopy or flexible sigmoidoscopy) you may notice spotting of blood in your stool or on the toilet paper. If you underwent a bowel prep for your procedure, you may not have a normal bowel movement for a few days.  Please Note:  You might notice some irritation and congestion in your nose or some drainage.  This is from the oxygen used during your procedure.  There is no need for concern and it should clear up in a day or so.  SYMPTOMS TO REPORT IMMEDIATELY:    Following upper endoscopy (EGD)  Vomiting of blood or coffee ground material  New chest pain or pain under the shoulder blades  Painful or persistently difficult swallowing  New shortness of breath  Fever of 100F or higher  Black, tarry-looking stools  For urgent or emergent issues, a gastroenterologist can be reached at any hour by calling [phone].   DIET:  We do recommend a small meal at first, but then you may proceed to your regular diet.  Drink plenty of fluids but you should avoid alcoholic beverages for 24 hours.  ACTIVITY:  You should plan to take it easy for the rest of today and you  should NOT DRIVE or use heavy machinery until tomorrow (because of the sedation medicines used during the test).    FOLLOW UP: Our staff will call the number listed on your records the next business day following your procedure to check on you and address any questions or concerns that you may have regarding the information given to you following your procedure. If we do not reach you, we will leave a message.  However, if you are feeling well and you are not experiencing any problems, there is no need to return our call.  We will assume that you have returned to your regular daily activities without incident.  If any biopsies were taken you will be contacted by phone or by letter within the next 1-3 weeks.  Please call us at [phone] if you have not heard about the biopsies in 3 weeks.    SIGNATURES/CONFIDENTIALITY: You and/or your care partner have signed paperwork which will be entered into your electronic medical record.  These signatures attest to the fact that that the information above on your After Visit Summary has been reviewed and is understood.  Full responsibility of the confidentiality of this discharge information lies with you and/or your care-partner. 

## 2016-05-03 NOTE — Op Note (Signed)
[facility] Patient Name: Carla Wagner Procedure Date: 05/03/2016 2:13 PM MRN: [id number] Endoscopist: Rachael Feeaniel P Monterio Bob , MD Age: [age] Referring MD:  Date of Birth: 07/12/1975 Gender: Female Account #: [id number] Procedure:                Upper GI endoscopy Indications:              Dysphagia, Heartburn Medicines:                Monitored Anesthesia Care Procedure:                Pre-Anesthesia Assessment:                           - Prior to the procedure, a History and Physical                            was performed, and patient medications and                            allergies were reviewed. The patient's tolerance of                            previous anesthesia was also reviewed. The risks                            and benefits of the procedure and the sedation                            options and risks were discussed with the patient.                            All questions were answered, and informed consent                            was obtained. Prior Anticoagulants: The patient has                            taken no previous anticoagulant or antiplatelet                            agents. ASA Grade Assessment: II - A patient with                            mild systemic disease. After reviewing the risks                            and benefits, the patient was deemed in                            satisfactory condition to undergo the procedure.                           After obtaining informed consent, the endoscope was  passed under direct vision. Throughout the                            procedure, the patient's blood pressure, pulse, and                            oxygen saturations were monitored continuously. The                            Model GIF-HQ190 [id number]) scope was introduced                            through the mouth, and advanced to the second part                            of duodenum. The upper GI  endoscopy was                            accomplished without difficulty. The patient                            tolerated the procedure well. Scope In: Scope Out: Findings:                 One mild benign-appearing, intrinsic stenosis was                            found at the gastroesophageal junction (very mild                            narrowing, normal mucosa). A TTS dilator was passed                            through the scope. Dilation with an 18-19-20 mm                            balloon dilator was performed to 20 mm.                           The exam was otherwise without abnormality. Complications:            No immediate complications. Estimated blood loss:                            None. Estimated Blood Loss:     Estimated blood loss: none. Impression:               - Benign-appearing esophageal stenosis. Dilated.                           - The examination was otherwise normal.                           - No specimens collected. Recommendation:           - Patient has a contact number available for  emergencies. The signs and symptoms of potential                            delayed complications were discussed with the                            patient. Return to normal activities tomorrow.                            Written discharge instructions were provided to the                            patient.                           - Resume previous diet. Chew your foof well, eat                            slowly and take small bites.                           - Continue present medications. Zegeride once daily.                           - Repeat upper endoscopy PRN for retreatment. Rachael Feeaniel P Ramandeep Arington, MD 05/03/2016 2:27:20 PM This report has been signed electronically.

## 2016-05-03 NOTE — Progress Notes (Signed)
To recovery, report to McCoy, RN, VSS 

## 2016-05-03 NOTE — Progress Notes (Signed)
Called to room to assist during endoscopic procedure.  Patient ID and intended procedure confirmed with present staff. Received instructions for my participation in the procedure from the performing physician.  

## 2016-05-04 ENCOUNTER — Telehealth: Payer: Self-pay

## 2016-05-04 NOTE — Telephone Encounter (Signed)
  Follow up Call-  Call back number 05/03/2016  Post procedure Call Back phone  # #[phone] cell  Permission to leave phone message Yes  Some recent data might be hidden     Patient questions:  Do you have a fever, pain , or abdominal swelling? No. Pain Score  0 *  Have you tolerated food without any problems? Yes.    Have you been able to return to your normal activities? Yes.    Do you have any questions about your discharge instructions: Diet   No. Medications  No. Follow up visit  No.  Do you have questions or concerns about your Care? No.  Actions: * If pain score is 4 or above: No action needed, pain <4.  No problems noted per the pt. maw

## 2016-05-26 ENCOUNTER — Encounter: Payer: Self-pay | Admitting: Neurology

## 2016-05-26 ENCOUNTER — Ambulatory Visit (INDEPENDENT_AMBULATORY_CARE_PROVIDER_SITE_OTHER): Payer: Managed Care, Other (non HMO) | Admitting: Neurology

## 2016-05-26 VITALS — BP 124/74 | HR 92 | Ht 71.0 in | Wt 245.3 lb

## 2016-05-26 DIAGNOSIS — Z720 Tobacco use: Secondary | ICD-10-CM

## 2016-05-26 DIAGNOSIS — G43009 Migraine without aura, not intractable, without status migrainosus: Secondary | ICD-10-CM

## 2016-05-26 MED ORDER — NORTRIPTYLINE HCL 10 MG PO CAPS: 10.0000 mg | ORAL_CAPSULE | Freq: Every day | ORAL | 3 refills | Status: DC

## 2016-05-26 NOTE — Patient Instructions (Signed)
Migraine Recommendations: 1.  Start nortriptyline 10mg  at bedtime.  Call in 6 weeks with update and we can adjust dose if needed. 2.  Take Maxalt (rizatriptan) 10mg  at earliest onset of headache.  May repeat dose once in 2 hours if needed.  Do not exceed two tablets in 24 hours. 3.  Limit use of pain relievers to no more than 2 days out of the week.  These medications include acetaminophen, ibuprofen, triptans and narcotics.  This will help reduce risk of rebound headaches. 4.  Be aware of common food triggers such as processed sweets, processed foods with nitrites (such as deli meat, hot dogs, sausages), foods with MSG, alcohol (such as wine), chocolate, certain cheeses, certain fruits (dried fruits, some citrus fruit), vinegar, diet soda. 4.  Avoid caffeine 5.  Routine exercise 6.  Proper sleep hygiene 7.  Stay adequately hydrated with water 8.  Keep a headache diary. 9.  Maintain proper stress management. 10.  Do not skip meals. 11.  Consider supplements:  Magnesium citrate 400mg  to 600mg  daily, riboflavin 400mg , Coenzyme Q 10 100mg  three times daily, turmeric 500mg  twice daily 12.  Quit smoking 13.  Follow up in 4 months.

## 2016-05-26 NOTE — Progress Notes (Signed)
NEUROLOGY FOLLOW UP OFFICE NOTE  Carla Wagner [id number]  HISTORY OF PRESENT ILLNESS: Carla Rudinisha Sewell is a 41 year old right-handed female with tobacco abuse, depression and hypertension who follows up for migraine.     UPDATE: Intensity: 6-7/10 Duration:  Less than 1 hour with Maxalt Frequency:  1 to 2 days a month (but has 10 days of tension type headache Current abortive medication:  Maxalt 10mg  (takes tylenol or ibuprofen for tension headache) Current preventative:  None.   Still smokes cigarettes.  Does not exercise.  Drinks a lot of water.   HISTORY: Onset:  February 2016, hit the right side of back of head on headboard.  No loss of consciousness. Location:  Starts in back of head on right side, up the right side of head and across the front. Quality:  Aching in back of head, shooting over the right side of head, throbbing of the forehead Initial Intensity:  8-9/10; June: 7-8/10 Aura:  no Prodrome:  no Associated symptoms:  Nausea, photophobia, phonophobia, lightheadedness.  No vomiting or visual disturbance. Initial Duration:  2-3 days; June: With Excedrin Migraine, edge is taken off in about 30-45 minutes and then she lays down, but not aborted. Initial Frequency:  2 to 4 times a month (approximately 15 headache days total per month); June: About 7 days per month, usually in clusters Triggers/exacerbating factors:  Light, walking around Relieving factors:  sleep Activity:  Needs to lay down   Past abortive medication:  Tylenol, Bupap, Norco.  Sumatriptan (thinks she may have had a side effect but not anaphylaxis).  Unable to take NSAIDs due to upset stomach. Past preventative medication:  none Other past therapy:  none   MRI of brain with and without contrast performed on 10/11/14 showed few small nonspecific hyperintensities in the frontal subcortical white matter, of uncertain significance but likely related to history of hypertension and smoking.   Family history of  headache:  No.  No family history of cerebral aneurysm. No personal history of migraines.  PAST MEDICAL HISTORY: Past Medical History:  Diagnosis Date  . Anxiety   . Depression   . GERD (gastroesophageal reflux disease)   . Headache   . Hypertension   . Sleep apnea    no c-pap    MEDICATIONS: Current Outpatient Prescriptions on File Prior to Visit  Medication Sig Dispense Refill  . acetaminophen (TYLENOL) 500 MG tablet Take 1,000 mg by mouth every 6 (six) hours as needed for moderate pain.    Marland Kitchen. losartan-hydrochlorothiazide (HYZAAR) 100-25 MG per tablet Take 1 tablet by mouth daily.    Marland Kitchen. omeprazole-sodium bicarbonate (ZEGERID) 40-1100 MG capsule Take 1 capsule by mouth daily before breakfast. 30 capsule 11  . omeprazole-sodium bicarbonate (ZEGERID) 40-1100 MG per capsule Take 1 capsule by mouth.    . rizatriptan (MAXALT) 10 MG tablet Take 1 at earliest onset of headache.  May repeat x1 in 2 hours if needed 10 tablet 4  . Vitamin D, Ergocalciferol, (DRISDOL) 50000 units CAPS capsule Take 1 capsule by mouth once a week.  0   Current Facility-Administered Medications on File Prior to Visit  Medication Dose Route Frequency Provider Last Rate Last Dose  . 0.9 %  sodium chloride infusion  500 mL Intravenous Continuous Rachael Feeaniel P Jacobs, MD        ALLERGIES: Allergies  Allergen Reactions  . Lisinopril Cough  . Lisinopril Cough    FAMILY HISTORY: Family History  Problem Relation Age of Onset  . Depression Mother   .  Cancer Father     prostate   . Alcohol abuse Brother   . Cancer Maternal Grandmother     stomach  . Stomach cancer Maternal Grandmother   . Heart disease Maternal Grandfather   . Heart failure Maternal Grandfather   . Colon cancer Neg Hx   . Esophageal cancer Neg Hx   . Pancreatic cancer Neg Hx   . Rectal cancer Neg Hx     SOCIAL HISTORY: Social History   Social History  . Marital status: Married    Spouse name: N/A  . Number of children: 3  . Years of  education: N/A   Occupational History  . Not on file.   Social History Main Topics  . Smoking status: Current Every Day Smoker    Packs/day: 0.25    Types: Cigarettes  . Smokeless tobacco: Never Used     Comment: patient is aware she needs to quit   . Alcohol use 0.0 oz/week     Comment: 0ccaionally  . Drug use: No  . Sexual activity: Yes    Partners: Male   Other Topics Concern  . Not on file   Social History Narrative   ** Merged History Encounter **        REVIEW OF SYSTEMS: Constitutional: No fevers, chills, or sweats, no generalized fatigue, change in appetite Eyes: No visual changes, double vision, eye pain Ear, nose and throat: No hearing loss, ear pain, nasal congestion, sore throat Cardiovascular: No chest pain, palpitations Respiratory:  No shortness of breath at rest or with exertion, wheezes GastrointestinaI: No nausea, vomiting, diarrhea, abdominal pain, fecal incontinence Genitourinary:  No dysuria, urinary retention or frequency Musculoskeletal:  No neck pain, back pain Integumentary: No rash, pruritus, skin lesions Neurological: as above Psychiatric: No depression, insomnia, anxiety Endocrine: No palpitations, fatigue, diaphoresis, mood swings, change in appetite, change in weight, increased thirst Hematologic/Lymphatic:  No purpura, petechiae. Allergic/Immunologic: no itchy/runny eyes, nasal congestion, recent allergic reactions, rashes  PHYSICAL EXAM: Vitals:   05/26/16 1428  BP: 124/74  Pulse: 92   General: No acute distress.  Patient appears well-groomed.   Head:  Normocephalic/atraumatic Eyes:  Fundi examined but not visualized Neck: supple, no paraspinal tenderness, full range of motion Heart:  Regular rate and rhythm Lungs:  Clear to auscultation bilaterally Back: No paraspinal tenderness Neurological Exam: alert and oriented to person, place, and time. Attention span and concentration intact, recent and remote memory intact, fund of  knowledge intact.  Speech fluent and not dysarthric, language intact.  CN II-XII intact. Bulk and tone normal, muscle strength 5/5 throughout.  Sensation to light touch  intact.  Deep tendon reflexes 2+ throughout.  Finger to nose testing intact.  Gait normal.  IMPRESSION: Migraine without aura Tobacco use  PLAN: 1.  Start nortriptyline 10mg  at bedtime 2.  Maxalt as needed 3.  Quit smoking, exercise 4.  Follow up in 4 months.  Shon Millet, DO  CC:  Lindaann Pascal, PA-C

## 2017-06-09 ENCOUNTER — Telehealth: Payer: Self-pay | Admitting: Gastroenterology

## 2017-06-10 NOTE — Telephone Encounter (Signed)
Dr. Leone PayorGessner,  Will you accept this patient?

## 2017-06-10 NOTE — Telephone Encounter (Signed)
Called both #'s and neither one is the correct numbers.  Will await a call from patient

## 2017-06-10 NOTE — Telephone Encounter (Signed)
OK 

## 2017-06-10 NOTE — Telephone Encounter (Signed)
That is OK with me

## 2017-11-10 ENCOUNTER — Telehealth: Payer: Self-pay | Admitting: Gastroenterology

## 2017-11-11 ENCOUNTER — Other Ambulatory Visit: Payer: Self-pay | Admitting: Gastroenterology

## 2017-11-11 MED ORDER — OMEPRAZOLE-SODIUM BICARBONATE 40-1100 MG PO CAPS: 1.0000 | ORAL_CAPSULE | Freq: Every day | ORAL | 6 refills | Status: AC

## 2019-01-10 ENCOUNTER — Other Ambulatory Visit: Payer: Self-pay

## 2019-01-10 DIAGNOSIS — Z20822 Contact with and (suspected) exposure to covid-19: Secondary | ICD-10-CM

## 2019-01-11 LAB — NOVEL CORONAVIRUS, NAA: SARS-CoV-2, NAA: NOT DETECTED

## 2023-01-21 ENCOUNTER — Encounter: Payer: Self-pay | Admitting: Podiatry

## 2023-01-21 ENCOUNTER — Ambulatory Visit (INDEPENDENT_AMBULATORY_CARE_PROVIDER_SITE_OTHER): Payer: BC Managed Care – PPO

## 2023-01-21 ENCOUNTER — Ambulatory Visit (INDEPENDENT_AMBULATORY_CARE_PROVIDER_SITE_OTHER): Payer: BC Managed Care – PPO | Admitting: Podiatry

## 2023-01-21 DIAGNOSIS — M778 Other enthesopathies, not elsewhere classified: Secondary | ICD-10-CM

## 2023-01-21 DIAGNOSIS — M76821 Posterior tibial tendinitis, right leg: Secondary | ICD-10-CM | POA: Diagnosis not present

## 2023-01-21 DIAGNOSIS — M76822 Posterior tibial tendinitis, left leg: Secondary | ICD-10-CM

## 2023-01-21 DIAGNOSIS — M7662 Achilles tendinitis, left leg: Secondary | ICD-10-CM

## 2023-01-21 MED ORDER — TRIAMCINOLONE ACETONIDE 10 MG/ML IJ SUSP
10.0000 mg | Freq: Once | INTRAMUSCULAR | Status: AC
Start: 2023-01-21 — End: 2023-01-21
  Administered 2023-01-21: 10 mg via INTRA_ARTICULAR

## 2023-01-21 MED ORDER — DEXAMETHASONE SODIUM PHOSPHATE 120 MG/30ML IJ SOLN
4.0000 mg | Freq: Once | INTRAMUSCULAR | Status: DC
Start: 2023-01-21 — End: 2024-02-01

## 2023-01-21 MED ORDER — PREDNISONE 10 MG PO TABS: ORAL_TABLET | ORAL | 0 refills | Status: DC

## 2023-01-21 NOTE — Progress Notes (Signed)
Subjective:   Patient ID: Carla Wagner, female   DOB: 47 y.o.   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  Patient smokes quarter pack per day tries to be active   Review of Systems  All other systems reviewed and are negative.       Objective:  Physical Exam Vitals and nursing note reviewed.  Constitutional:      Appearance: She is well-developed.  Pulmonary:     Effort: Pulmonary effort is normal.  Musculoskeletal:        General: Normal range of motion.  Skin:    General: Skin is warm.  Neurological:     Mental Status: She is alert.     Neurovascular status intact muscle strength was found to be adequate range of motion adequate.  Patient is noted to have inflammation of the posterior heel lateral side left and is also noted to have inflammation pain along the peroneal insertion of a milder nature bilateral and into the plantar aspect of the arch bilateral.  Patient is found a good digital perfusion well-oriented x 3     Assessment:  Acute Achilles tendinitis left with patient standing more over the last 4 months along with other moderate discomforts     Plan:  H&P reviewed at great length and the multiple problems that the patient is having.  At this point I went ahead and I am going to work on treating as much as I can but I am focusing on the heel and I explained heel injection and risk and she wants to have this done and just lateral side we did an injection stain away central medial 3 mg dexamethasone Kenalog 5 mg Xylocaine and applied air fracture walker for complete immobilization.  Put on Sterapred DS Dosepak reappoint to recheck again in 3 weeks  X-rays indicate small spurs no indication of stress fracture or advanced arthritis  bilateral

## 2023-01-27 ENCOUNTER — Telehealth: Payer: Self-pay | Admitting: Podiatry

## 2023-01-27 ENCOUNTER — Encounter: Payer: Self-pay | Admitting: Podiatry

## 2023-01-27 NOTE — Telephone Encounter (Signed)
Pt called and her work is requesting a note for her to wear the boot at work, If there are any restrictions and for how long will she be in the boot? Email to [employer] in hr at [email or web address] and to her email that is in the chart.

## 2023-01-27 NOTE — Telephone Encounter (Signed)
That's fine for her to have note for the boot

## 2023-01-28 NOTE — Telephone Encounter (Signed)
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

## 2023-02-18 ENCOUNTER — Encounter: Payer: Self-pay | Admitting: Podiatry

## 2023-02-18 ENCOUNTER — Ambulatory Visit (INDEPENDENT_AMBULATORY_CARE_PROVIDER_SITE_OTHER): Payer: BC Managed Care – PPO | Admitting: Podiatry

## 2023-02-18 VITALS — BP 132/88 | HR 80

## 2023-02-18 DIAGNOSIS — M7672 Peroneal tendinitis, left leg: Secondary | ICD-10-CM

## 2023-02-18 DIAGNOSIS — M79672 Pain in left foot: Secondary | ICD-10-CM

## 2023-02-18 DIAGNOSIS — M778 Other enthesopathies, not elsewhere classified: Secondary | ICD-10-CM

## 2023-02-18 DIAGNOSIS — M79671 Pain in right foot: Secondary | ICD-10-CM

## 2023-02-18 DIAGNOSIS — M76822 Posterior tibial tendinitis, left leg: Secondary | ICD-10-CM

## 2023-02-18 DIAGNOSIS — M76821 Posterior tibial tendinitis, right leg: Secondary | ICD-10-CM | POA: Diagnosis not present

## 2023-02-18 DIAGNOSIS — M7671 Peroneal tendinitis, right leg: Secondary | ICD-10-CM

## 2023-02-18 DIAGNOSIS — M7662 Achilles tendinitis, left leg: Secondary | ICD-10-CM

## 2023-02-18 MED ORDER — METHYLPREDNISOLONE 4 MG PO TBPK
ORAL_TABLET | ORAL | 0 refills | Status: DC
Start: 2023-02-18 — End: 2024-02-01

## 2023-02-18 MED ORDER — MELOXICAM 15 MG PO TABS: 15.0000 mg | ORAL_TABLET | Freq: Every day | ORAL | 0 refills | Status: DC

## 2023-02-18 NOTE — Patient Instructions (Signed)
Use Boot left foot for two more weeks. Limit excessive ambulation and weight bearing activity during this time. May transition into shoes with heel lifts slowly over the following week. Follow up in 3 weeks.  Complete medrol dosepak taper before starting meloxicam

## 2023-02-20 ENCOUNTER — Encounter: Payer: Self-pay | Admitting: Podiatry

## 2023-02-20 NOTE — Addendum Note (Signed)
Addended by: Barbaraann Share on: 02/20/2023 10:20 PM   Modules accepted: Level of Service

## 2023-02-20 NOTE — Progress Notes (Signed)
Subjective:   Patient ID: Carla Wagner, female   DOB: 47 y.o.   MRN: [id number]   Foot Pain  Last seen: 01/21/23 Patient returns to clinic for follow up evaluation of left achilles tendinitis, bilateral foot pain. She received a cortisone injection to the left posterior lateral heel, been compliant with cam boot immobilization and completed prednisone tapering dose, and has improved her pain for this issue. She does still endorse some pain to the left Achilles, probably 60 to 70% improvement.  She does continue to endorse pain to the bilateral plantar medial arch regions and lateral foot pain at this point. She states she has had to be on her feet quite a bit for work at the [employer].   Review of Systems  All other systems reviewed and are negative.       Objective:  Physical Exam Vitals and nursing note reviewed.  Constitutional:      Appearance: She is well-developed.  Pulmonary:     Effort: Pulmonary effort is normal.  Musculoskeletal:        General: Normal range of motion.  Skin:    General: Skin is warm.  Neurological:     Mental Status: She is alert.     Neurovascular status intact muscle strength was found to be adequate range of motion adequate.  Tenderness and inflammation to the left posterior lateral heel improved from previous.  Tenderness on palpation of the PT tendon insertion along the navicular and plantar medial arch, along the peroneal tendons and fifth met base most notable today. Patient is found a good digital perfusion well-oriented x 3     Assessment:  Achilles Tendonitis left side improvming Bilateral PTTD, Peroneal tendinitis with moderate discomfort     Plan:  Clinical findings and treatment plan discussed with patient -At this point would like to see her continue in the cam walker boot for another 2 weeks with heel lifts at this time she can transition into good supportive shoe gear starting it with heel lifts. -Dispensed power step orthotics in  an effort to limit strain on PT tendons.  I do think the onset of her complaint with increased time weightbearing on hard surfaces is the aggravating factor and that the lateral peroneal pain is secondary to this as well -Will try another prednisone taper, 6 day course, followed by oral meloxicam. -Stretching regimen discussed with patient for the Achilles tendinitis, PTTD and peroneal tendinitis. -Return to clinic in 3 to 4 weeks to assess progression.  Contact office if symptoms worsen

## 2023-02-24 ENCOUNTER — Telehealth: Payer: Self-pay | Admitting: Podiatry

## 2023-02-24 NOTE — Telephone Encounter (Signed)
Patient typically sees Dr. Charlsie Merles and saw Dr. Jamse Arn on 9/27 for left achilles pain f/u.  She was asking if she could get some reflexology on the area of both feet, since she is now starting to feel some right achilles pain since wearing the camwalker on the left foot.  I double-checked to make sure at least a few days have passed from any injections.  Informed patient it would be okay to try reflexology on the feet/heels and to let us know how she does with it.  If anything makes the pain worse, please discontinue.

## 2023-03-07 ENCOUNTER — Ambulatory Visit (INDEPENDENT_AMBULATORY_CARE_PROVIDER_SITE_OTHER): Payer: BC Managed Care – PPO | Admitting: Podiatry

## 2023-03-07 ENCOUNTER — Encounter: Payer: Self-pay | Admitting: Podiatry

## 2023-03-07 ENCOUNTER — Ambulatory Visit (INDEPENDENT_AMBULATORY_CARE_PROVIDER_SITE_OTHER): Payer: BC Managed Care – PPO

## 2023-03-07 DIAGNOSIS — M778 Other enthesopathies, not elsewhere classified: Secondary | ICD-10-CM

## 2023-03-07 DIAGNOSIS — M7671 Peroneal tendinitis, right leg: Secondary | ICD-10-CM

## 2023-03-07 MED ORDER — TRIAMCINOLONE ACETONIDE 10 MG/ML IJ SUSP
10.0000 mg | Freq: Once | INTRAMUSCULAR | Status: DC
Start: 2023-03-07 — End: 2024-02-01

## 2023-03-09 NOTE — Progress Notes (Signed)
Subjective:   Patient ID: Carla Wagner, female   DOB: 47 y.o.   MRN: [id number]   HPI Patient presents stating the left Achilles is doing much better with diminishment of discomfort and pain only upon deep palpation with problems now with the right foot that has started recently.   ROS      Objective:  Physical Exam  Neurovascular status intact muscle strength adequate with inflammation pain around the lateral side of the right foot base of fifth metatarsal fluid buildup no indication of muscle strength loss     Assessment:  Probability for peroneal tendinitis right     Plan:  H&P reviewed and I went ahead and reviewed x-ray and then did after explaining risk sterile prep and injected carefully the sheath right 3 mg dexamethasone Kenalog 5 mg Xylocaine to the insertion of the peroneal tendon base of fifth metatarsal and applied sterile dressing.  Instructed on supportive shoes reduced activity and will reappoint to check  X-rays indicate no signs of bony issue fracture or arthritis around the lateral side right foot

## 2023-05-03 ENCOUNTER — Telehealth: Payer: Self-pay

## 2023-05-03 NOTE — Telephone Encounter (Signed)
Patient called stating that she had injection in her feet. She states that her left foot is numb and her right foot feels like something is coming out of it. Please advise.

## 2023-05-04 NOTE — Telephone Encounter (Signed)
Should get better over next few days

## 2023-05-04 NOTE — Telephone Encounter (Signed)
I returned call to patient,but no answer. I left a detailed message for patient and to call the office if any other further questions or concerns.

## 2024-02-01 ENCOUNTER — Encounter: Payer: Self-pay | Admitting: Emergency Medicine

## 2024-02-01 ENCOUNTER — Ambulatory Visit
Admission: EM | Admit: 2024-02-01 | Discharge: 2024-02-01 | Disposition: A | Discharge status: Home / Self Care | Payer: PRIVATE HEALTH INSURANCE | Attending: Nurse Practitioner | Admitting: Nurse Practitioner

## 2024-02-01 DIAGNOSIS — U071 COVID-19: Secondary | ICD-10-CM | POA: Diagnosis not present

## 2024-02-01 LAB — POC SOFIA SARS ANTIGEN FIA: SARS Coronavirus 2 Ag: POSITIVE — AB

## 2024-02-01 MED ORDER — IBUPROFEN 800 MG PO TABS: 800.0000 mg | ORAL_TABLET | Freq: Three times a day (TID) | ORAL | 0 refills | Status: DC | PRN

## 2024-02-01 MED ORDER — PAXLOVID (300/100) 20 X 150 MG & 10 X 100MG PO TBPK
3.0000 | ORAL_TABLET | Freq: Two times a day (BID) | ORAL | 0 refills | Status: AC
Start: 2024-02-01 — End: 2024-02-06

## 2024-02-01 MED ORDER — PROMETHAZINE-DM 6.25-15 MG/5ML PO SYRP: 10.0000 mL | ORAL_SOLUTION | Freq: Four times a day (QID) | ORAL | 0 refills | Status: DC | PRN

## 2024-02-01 NOTE — ED Triage Notes (Signed)
 Pt presents c/o fever, cough, runny nose, fatigue, and headache x 2 days. Pt reports OTC cold meds. Pt denies emesis and diarrhea.

## 2024-02-01 NOTE — ED Provider Notes (Signed)
 [facility]    CSN: [id number] Arrival date & time: 02/01/24  1833      History   Chief Complaint Chief Complaint  Patient presents with   URI    HPI Carla Wagner is a 48 y.o. female.   Discussed the use of AI scribe software for clinical note transcription with the patient, who gave verbal consent to proceed.   The patient presents with fever, cough, sore throat, and congestion. Symptoms began yesterday with a scratchy throat and mild cough. The patient reports the cough worsened significantly last night, interfering with sleep. A fever developed, possibly starting last night, though the patient had taken medication which may have masked its onset. Associated symptoms include chills, body aches, runny nose, and sneezing. The patient attempted to work today but left after half a day due to worsening symptoms. They have been taking Coricidin Cold and Flu for symptom relief. The patient recently celebrated her 48th birthday with a large party and also went to a club, which may have been potential exposure sources. The patient is a current cigarette smoker and expresses a desire to quit. She has a history of hypertension and are on blood pressure medication. She denies any history of high cholesterol or kidney disease.  The following sections of the patient's history were reviewed and updated as appropriate: allergies, current medications, past family history, past medical history, past social history, past surgical history, and problem list.       Past Medical History:  Diagnosis Date   Anxiety    Depression    GERD (gastroesophageal reflux disease)    Headache    Hypertension    Sleep apnea    no c-pap    Patient Active Problem List   Diagnosis Date Noted   Intractable migraine without aura and without status migrainosus 01/13/2015   BPPV (benign paroxysmal positional vertigo) 01/13/2015   Essential hypertension 01/13/2015   Tobacco abuse 01/13/2015    Depression 01/13/2015    Past Surgical History:  Procedure Laterality Date   ABDOMINAL HYSTERECTOMY     BACK SURGERY     CHOLECYSTECTOMY     COLONOSCOPY     UPPER GASTROINTESTINAL ENDOSCOPY      OB History     Gravida  0   Para  0   Term  0   Preterm  0   AB  0   Living         SAB  0   IAB  0   Ectopic  0   Multiple      Live Births               Home Medications    Prior to Admission medications   Medication Sig Start Date End Date Taking? Authorizing Provider  ibuprofen  (ADVIL ) 800 MG tablet Take 1 tablet (800 mg total) by mouth every 8 (eight) hours as needed (pain). Take with food to avoid stomach upset. Do not take any additional NSAIDs while on this. You may take tylenol in addition to this if needed for extra pain relief. 02/01/24  Yes Iola Lukes, FNP  nirmatrelvir/ritonavir (PAXLOVID , 300/100,) 20 x 150 MG & 10 x 100MG  TBPK Take 3 tablets by mouth 2 (two) times daily for 5 days. Patient GFR is greater than 60 Take nirmatrelvir (150 mg) two tablets twice daily for 5 days and ritonavir (100 mg) one tablet twice daily for 5 days. 02/01/24 02/06/24 Yes Skyley Grandmaison, FNP  promethazine -dextromethorphan (PROMETHAZINE -DM) 6.25-15 MG/5ML syrup Take  10 mLs by mouth every 6 (six) hours as needed for cough. 02/01/24  Yes Dula Havlik, FNP  acetaminophen (TYLENOL) 500 MG tablet Take 1,000 mg by mouth every 6 (six) hours as needed for moderate pain.    [provider]  losartan-hydrochlorothiazide (HYZAAR) 100-25 MG per tablet Take 1 tablet by mouth daily.    [provider]  meloxicam  (MOBIC ) 15 MG tablet Take 1 tablet (15 mg total) by mouth daily. 02/18/23   Lamount Ethan CROME, DPM  nortriptyline  (PAMELOR ) 10 MG capsule Take 1 capsule (10 mg total) by mouth at bedtime. 05/26/16   Skeet Juliene SAUNDERS, DO  omeprazole -sodium bicarbonate  (ZEGERID ) 40-1100 MG capsule Take 1 capsule by mouth daily before breakfast. Patient will need appointment for  further refills. 11/11/17   Teressa Toribio SQUIBB, MD  omeprazole -sodium bicarbonate  (ZEGERID ) 40-1100 MG per capsule Take 1 capsule by mouth. 08/07/14   Con Lauraine HERO, FNP  rizatriptan  (MAXALT ) 10 MG tablet Take 1 at earliest onset of headache.  May repeat x1 in 2 hours if needed 11/17/15   Skeet Juliene SAUNDERS, DO  Vitamin D, Ergocalciferol, (DRISDOL) 50000 units CAPS capsule Take 1 capsule by mouth once a week. 04/01/16   [provider]    Family History Family History  Problem Relation Age of Onset   Depression Mother    Cancer Father        prostate    Alcohol abuse Brother    Cancer Maternal Grandmother        stomach   Stomach cancer Maternal Grandmother    Heart disease Maternal Grandfather    Heart failure Maternal Grandfather    Colon cancer Neg Hx    Esophageal cancer Neg Hx    Pancreatic cancer Neg Hx    Rectal cancer Neg Hx     Social History Social History   Tobacco Use   Smoking status: Every Day    Current packs/day: 0.25    Types: Cigarettes    Passive exposure: Current   Smokeless tobacco: Never   Tobacco comments:    patient is aware she needs to quit   Vaping Use   Vaping status: Never Used  Substance Use Topics   Alcohol use: Yes    Alcohol/week: 0.0 standard drinks of alcohol    Comment: 0ccaionally   Drug use: No     Allergies   Lisinopril, Losartan, and Lisinopril   Review of Systems Review of Systems  Constitutional:  Positive for chills, fatigue and fever.  HENT:  Positive for congestion, rhinorrhea and sneezing. Negative for sore throat (scratchy).   Respiratory:  Positive for cough. Negative for shortness of breath and wheezing.   Gastrointestinal:  Negative for diarrhea, nausea and vomiting.  Musculoskeletal:  Positive for myalgias.  Neurological:  Positive for headaches.  All other systems reviewed and are negative.    Physical Exam Triage Vital Signs ED Triage Vitals  Encounter Vitals Group     BP 02/01/24 1916 (!) 157/101      Girls Systolic BP Percentile --      Girls Diastolic BP Percentile --      Boys Systolic BP Percentile --      Boys Diastolic BP Percentile --      Pulse Rate 02/01/24 1916 94     Resp 02/01/24 1916 18     Temp 02/01/24 1916 (!) 101.6 F (38.7 C)     Temp Source 02/01/24 1916 Oral     SpO2 02/01/24 1916 97 %  Weight 02/01/24 1915 245 lb 6 oz (111.3 kg)     Height --      Head Circumference --      Peak Flow --      Pain Score 02/01/24 1914 6     Pain Loc --      Pain Education --      Exclude from Growth Chart --    No data found.  Updated Vital Signs BP (!) 157/101 (BP Location: Left Arm)   Pulse 94   Temp (!) 101.6 F (38.7 C) (Oral)   Resp 18   Wt 245 lb 6 oz (111.3 kg)   SpO2 97%   BMI 34.22 kg/m   Visual Acuity Right Eye Distance:   Left Eye Distance:   Bilateral Distance:    Right Eye Near:   Left Eye Near:    Bilateral Near:     Physical Exam Vitals reviewed.  Constitutional:      General: She is awake. She is not in acute distress.    Appearance: Normal appearance. She is well-developed. She is ill-appearing. She is not toxic-appearing or diaphoretic.  HENT:     Head: Normocephalic.     Right Ear: Tympanic membrane, ear canal and external ear normal. No drainage, swelling or tenderness. No middle ear effusion. Tympanic membrane is not erythematous.     Left Ear: Tympanic membrane, ear canal and external ear normal. No drainage, swelling or tenderness.  No middle ear effusion. Tympanic membrane is not erythematous.     Nose: Congestion present. No rhinorrhea.     Mouth/Throat:     Lips: Pink.     Mouth: Mucous membranes are moist.     Pharynx: No pharyngeal swelling, oropharyngeal exudate, posterior oropharyngeal erythema or uvula swelling.     Tonsils: No tonsillar exudate or tonsillar abscesses.  Eyes:     General: Vision grossly intact.     Conjunctiva/sclera: Conjunctivae normal.  Cardiovascular:     Rate and Rhythm: Normal rate.     Heart  sounds: Normal heart sounds.  Pulmonary:     Effort: Pulmonary effort is normal. No tachypnea or respiratory distress.     Breath sounds: Normal breath sounds and air entry.  Musculoskeletal:        General: Normal range of motion.     Cervical back: Normal range of motion and neck supple.  Lymphadenopathy:     Cervical: No cervical adenopathy.  Skin:    General: Skin is warm and dry.  Neurological:     General: No focal deficit present.     Mental Status: She is alert and oriented to person, place, and time.  Psychiatric:        Behavior: Behavior is cooperative.      UC Treatments / Results  Labs (all labs ordered are listed, but only abnormal results are displayed) Labs Reviewed  POC SOFIA SARS ANTIGEN FIA - Abnormal; Notable for the following components:      Result Value   SARS Coronavirus 2 Ag Positive (*)    All other components within normal limits    EKG   Radiology No results found.  Procedures Procedures (including critical care time)  Medications Ordered in UC Medications - No data to display  Initial Impression / Assessment and Plan / UC Course  I have reviewed the triage vital signs and the nursing notes.  Pertinent labs & imaging results that were available during my care of the patient were reviewed by me and considered  in my medical decision making (see chart for details).     The patient presents with symptoms of an acute respiratory illness and tested positive for COVID-19. Given her history of hypertension, Paxlovid  will be prescribed. She does not have any history of CKD. The patient should maintain hydration and get adequate rest. Education was provided regarding the current understanding of COVID-19, which has generally become less severe compared to earlier stages of the pandemic due to increased immunity and viral evolution. The patient was counseled on updated CDC guidance, which no longer requires a strict five-day isolation period. Instead,  return to normal activities is appropriate once symptoms are improving overall and the patient has been fever-free for at least 24 hours without fever-reducing medications. Close monitoring of symptoms over the next several days was advised, with follow-up with primary care if symptoms are not improving within a week. The patient should seek emergency care for shortness of breath, chest pain, dizziness, fainting, confusion, severe weakness, or inability to keep fluids down.  Today's evaluation has revealed no signs of a dangerous process. Discussed diagnosis with patient and/or guardian. Patient and/or guardian aware of their diagnosis, possible red flag symptoms to watch out for and need for close follow up. Patient and/or guardian understands verbal and written discharge instructions. Patient and/or guardian comfortable with plan and disposition.  Patient and/or guardian has a clear mental status at this time, good insight into illness (after discussion and teaching) and has clear judgment to make decisions regarding their care  Documentation was completed with the aid of voice recognition software. Transcription may contain typographical errors.  Final Clinical Impressions(s) / UC Diagnoses   Final diagnoses:  COVID-19     Discharge Instructions      You have tested positive for COVID-19. COVID is a viral infection, and antibiotics are not used to treat viral illnesses because they are designed to kill bacteria, not viruses.   You have been prescribed a medication called Paxlovid , which is an antiviral used to treat mild to moderate cases of COVID-19. It is important to take Paxlovid  exactly as prescribed and not to skip any doses. Some people may experience side effects such as a change in taste, diarrhea, high blood pressure, or muscle aches. These side effects are usually mild and go away after the treatment is complete. Take tylenol and/or ibuprofen  as needed for fevers, headache and body  aches. Be sure to drink plenty of fluids and get plenty of rest while you recover.   COVID-19 generally appears to be less severe now compared to earlier stages of the pandemic back in 2020. This is likely due to a combination of factors, including increased population immunity from vaccination and prior infections, as well as the evolution of the virus towards less virulent strains. While new variants continue to emerge, they generally cause milder illness, especially in individuals with prior immunity.  Because of this, the CDC has updated its isolation guidance for COVID-19 and states that a 5-day isolation period following a positive test result is no longer needed. Under the new guidelines, people will not need to isolate if they are fever-free for at least 24 hours without medication and if their symptoms are mild and improving. You may return to normal activities if your symptoms are overall improving and you have been without a fever for 24 hours without taking fever reducing medications like Tylenol or ibuprofen .   Monitor your symptoms closely over the next several days. If your symptoms are not improving  within a week, follow up with your primary care provider.  Go to the emergency department right away if you develop shortness of breath, chest pain, dizziness, fainting, confusion, severe weakness, or if you are unable to keep fluids down, as these may be signs of more serious illness.       ED Prescriptions     Medication Sig Dispense Auth. Provider   nirmatrelvir/ritonavir (PAXLOVID , 300/100,) 20 x 150 MG & 10 x 100MG  TBPK Take 3 tablets by mouth 2 (two) times daily for 5 days. Patient GFR is greater than 60 Take nirmatrelvir (150 mg) two tablets twice daily for 5 days and ritonavir (100 mg) one tablet twice daily for 5 days. 30 tablet Iola Lukes, FNP   ibuprofen  (ADVIL ) 800 MG tablet Take 1 tablet (800 mg total) by mouth every 8 (eight) hours as needed (pain). Take with food to  avoid stomach upset. Do not take any additional NSAIDs while on this. You may take tylenol in addition to this if needed for extra pain relief. 21 tablet Iola Lukes, FNP   promethazine -dextromethorphan (PROMETHAZINE -DM) 6.25-15 MG/5ML syrup Take 10 mLs by mouth every 6 (six) hours as needed for cough. 118 mL Iola Lukes, FNP      PDMP not reviewed this encounter.   Iola Lukes, [place] 02/01/24 1945

## 2024-02-01 NOTE — Discharge Instructions (Addendum)
 You have tested positive for COVID-19. COVID is a viral infection, and antibiotics are not used to treat viral illnesses because they are designed to kill bacteria, not viruses.   You have been prescribed a medication called Paxlovid , which is an antiviral used to treat mild to moderate cases of COVID-19. It is important to take Paxlovid  exactly as prescribed and not to skip any doses. Some people may experience side effects such as a change in taste, diarrhea, high blood pressure, or muscle aches. These side effects are usually mild and go away after the treatment is complete. Take tylenol  and/or ibuprofen  as needed for fevers, headache and body aches. Be sure to drink plenty of fluids and get plenty of rest while you recover.   COVID-19 generally appears to be less severe now compared to earlier stages of the pandemic back in 2020. This is likely due to a combination of factors, including increased population immunity from vaccination and prior infections, as well as the evolution of the virus towards less virulent strains. While new variants continue to emerge, they generally cause milder illness, especially in individuals with prior immunity.  Because of this, the CDC has updated its isolation guidance for COVID-19 and states that a 5-day isolation period following a positive test result is no longer needed. Under the new guidelines, people will not need to isolate if they are fever-free for at least 24 hours without medication and if their symptoms are mild and improving. You may return to normal activities if your symptoms are overall improving and you have been without a fever for 24 hours without taking fever reducing medications like Tylenol  or ibuprofen .   Monitor your symptoms closely over the next several days. If your symptoms are not improving within a week, follow up with your primary care provider.  Go to the emergency department right away if you develop shortness of breath, chest pain,  dizziness, fainting, confusion, severe weakness, or if you are unable to keep fluids down, as these may be signs of more serious illness.

## 2024-05-19 ENCOUNTER — Ambulatory Visit
Admission: EM | Admit: 2024-05-19 | Discharge: 2024-05-19 | Disposition: A | Discharge status: Home / Self Care | Payer: PRIVATE HEALTH INSURANCE

## 2024-05-19 DIAGNOSIS — J209 Acute bronchitis, unspecified: Secondary | ICD-10-CM

## 2024-05-19 MED ORDER — GUAIFENESIN ER 600 MG PO TB12: 600.0000 mg | ORAL_TABLET | Freq: Two times a day (BID) | ORAL | 0 refills | Status: AC

## 2024-05-19 MED ORDER — BENZONATATE 100 MG PO CAPS: 100.0000 mg | ORAL_CAPSULE | Freq: Three times a day (TID) | ORAL | 0 refills | Status: AC

## 2024-05-19 MED ORDER — PREDNISONE 50 MG PO TABS: ORAL_TABLET | ORAL | 0 refills | Status: AC

## 2024-05-19 NOTE — ED Triage Notes (Addendum)
 Pt reports right side upper chest pain x 1 week- worse with cough. States it aches. States a week ago she thinks she had the flu- cough, fever, body aches, head stuffy, fatigue. She states she thinks she may have had the flu but went back to work yesterday. She did not go to work today due to still feeling weak. Taking coricidin. No fever reducing meds taken in the last 24 hours.

## 2024-05-25 ENCOUNTER — Ambulatory Visit
Admission: EM | Admit: 2024-05-25 | Discharge: 2024-05-25 | Disposition: A | Discharge status: Home / Self Care | Attending: Family Medicine | Admitting: Family Medicine

## 2024-05-25 ENCOUNTER — Encounter: Payer: Self-pay | Admitting: *Deleted

## 2024-05-25 DIAGNOSIS — S61412A Laceration without foreign body of left hand, initial encounter: Secondary | ICD-10-CM | POA: Diagnosis not present

## 2024-05-25 MED ORDER — TETANUS-DIPHTH-ACELL PERTUSSIS 5-2-15.5 LF-MCG/0.5 IM SUSP
0.5000 mL | Freq: Once | INTRAMUSCULAR | Status: AC
  Administered 2024-05-25: 0.5 mL via INTRAMUSCULAR

## 2024-05-25 NOTE — ED Triage Notes (Signed)
 Pt reports she cut her left hand while breaking down cardboard boxes. Tetanus unknown

## 2024-05-25 NOTE — ED Provider Notes (Signed)
 " [facility]    CSN: [id number] Arrival date & time: 05/25/24  1736      History   Chief Complaint Chief Complaint  Patient presents with   Laceration    HPI Carla Wagner is a 49 y.o. female.    Laceration  Here for a cut on her left hand.  She was breaking down boxes and accidentally got her hand with the box cutter. Uncertain last tetanus.  She is allergic to lisinopril, and losartan.  Past Medical History:  Diagnosis Date   Anxiety    Depression    GERD (gastroesophageal reflux disease)    Headache    Hypertension    Sleep apnea    no c-pap    Patient Active Problem List   Diagnosis Date Noted   Intractable migraine without aura and without status migrainosus 01/13/2015   BPPV (benign paroxysmal positional vertigo) 01/13/2015   Essential hypertension 01/13/2015   Tobacco abuse 01/13/2015   Depression 01/13/2015    Past Surgical History:  Procedure Laterality Date   ABDOMINAL HYSTERECTOMY     BACK SURGERY     CHOLECYSTECTOMY     COLONOSCOPY     UPPER GASTROINTESTINAL ENDOSCOPY      OB History     Gravida  0   Para  0   Term  0   Preterm  0   AB  0   Living         SAB  0   IAB  0   Ectopic  0   Multiple      Live Births               Home Medications    Prior to Admission medications  Medication Sig Start Date End Date Taking? Authorizing Provider  acetaminophen (TYLENOL) 500 MG tablet Take 1,000 mg by mouth every 6 (six) hours as needed for moderate pain.   Yes [provider]  benzonatate  (TESSALON ) 100 MG capsule Take 1 capsule (100 mg total) by mouth every 8 (eight) hours. 05/19/24  Yes Andra Corean BROCKS, PA-C  FLUoxetine  (PROZAC ) 40 MG capsule Take 40 mg by mouth daily. 03/30/16  Yes [provider]  guaiFENesin  (MUCINEX ) 600 MG 12 hr tablet Take 1 tablet (600 mg total) by mouth 2 (two) times daily for 10 days. 05/19/24 05/29/24 Yes Andra Corean C, PA-C  omeprazole -sodium  bicarbonate (ZEGERID ) 40-1100 MG capsule Take 1 capsule by mouth daily before breakfast. Patient will need appointment for further refills. 11/11/17  Yes Teressa Toribio SQUIBB, MD  omeprazole -sodium bicarbonate  (ZEGERID ) 40-1100 MG per capsule Take 1 capsule by mouth. 08/07/14  Yes Con Lauraine HERO, FNP  predniSONE  (DELTASONE ) 50 MG tablet Take 1 tab po daily for 5 days Patient not taking: Reported on 05/25/2024 05/19/24   Andra Corean BROCKS, PA-C    Family History Family History  Problem Relation Age of Onset   Depression Mother    Cancer Father        prostate    Alcohol abuse Brother    Cancer Maternal Grandmother        stomach   Stomach cancer Maternal Grandmother    Heart disease Maternal Grandfather    Heart failure Maternal Grandfather    Colon cancer Neg Hx    Esophageal cancer Neg Hx    Pancreatic cancer Neg Hx    Rectal cancer Neg Hx     Social History Social History[1]   Allergies   Lisinopril, Losartan, and Lisinopril  Review of Systems Review of Systems   Physical Exam Triage Vital Signs ED Triage Vitals  Encounter Vitals Group     BP 05/25/24 1909 (!) 167/120     Girls Systolic BP Percentile --      Girls Diastolic BP Percentile --      Boys Systolic BP Percentile --      Boys Diastolic BP Percentile --      Pulse Rate 05/25/24 1909 92     Resp 05/25/24 1909 16     Temp 05/25/24 1909 98.3 F (36.8 C)     Temp Source 05/25/24 1909 Oral     SpO2 05/25/24 1909 98 %     Weight --      Height --      Head Circumference --      Peak Flow --      Pain Score 05/25/24 1920 2     Pain Loc --      Pain Education --      Exclude from Growth Chart --    No data found.  Updated Vital Signs BP (!) 167/120 (BP Location: Right Arm)   Pulse 92   Temp 98.3 F (36.8 C) (Oral)   Resp 16   SpO2 98%   Visual Acuity Right Eye Distance:   Left Eye Distance:   Bilateral Distance:    Right Eye Near:   Left Eye Near:    Bilateral Near:     Physical  Exam Vitals reviewed.  Constitutional:      General: She is not in acute distress.    Appearance: She is not ill-appearing or toxic-appearing.  Skin:    Coloration: Skin is not jaundiced or pale.     Comments: She has a linear laceration that is on the dorsal surface overlying the first metacarpal on the left hand.  It is fairly shallow but still gapes with pressure.  It is about 5 cm long.  Bleeding is well-controlled at the time of exam.  There is no swelling.  Neurovascular is intact.  The patient declined to let me take a photograph of her cut and hand.  Neurological:     General: No focal deficit present.     Mental Status: She is alert and oriented to person, place, and time.  Psychiatric:        Behavior: Behavior normal.      UC Treatments / Results  Labs (all labs ordered are listed, but only abnormal results are displayed) Labs Reviewed - No data to display  EKG   Radiology No results found.  Procedures Procedures (including critical care time)  Medications Ordered in UC Medications  Tdap (ADACEL) injection 0.5 mL (0.5 mLs Intramuscular Given 05/25/24 2001)    Initial Impression / Assessment and Plan / UC Course  I have reviewed the triage vital signs and the nursing notes.  Pertinent labs & imaging results that were available during my care of the patient were reviewed by me and considered in my medical decision making (see chart for details).     Since this was overlying the first MCP joint just a little bit, I felt that glue was not ideal for this area.  We discussed risks and benefits and she initially gave consent for suture repair of the laceration.  When I went to try to inject for infiltration anesthesia, she attempted to calm herself and do breathing exercises, but she could not reduce her anxiety about the needlesticks enough for her to allow  me to do the anesthesia.  We therefore decided to do a glue repair.  With staff assisting in  approximating the edges glue is applied with good results.  Staff has bandaged the hand and wound care is discussed.  Tdap is given.  Again she declined to have a photo taken of her hand after the repair also.  She stated I just do not see why that needs to be in the chart. Final Clinical Impressions(s) / UC Diagnoses   Final diagnoses:  Laceration of left hand without foreign body, initial encounter     Discharge Instructions      You have been given a Tdap vaccination to boost your tetanus immunity  The glue will slough off in the next few days.    ED Prescriptions   None    PDMP not reviewed this encounter.    [1]  Social History Tobacco Use   Smoking status: Every Day    Current packs/day: 0.25    Types: Cigarettes    Passive exposure: Current   Smokeless tobacco: Never   Tobacco comments:    patient is aware she needs to quit   Vaping Use   Vaping status: Never Used  Substance Use Topics   Alcohol use: Yes    Alcohol/week: 0.0 standard drinks of alcohol    Comment: 0ccaionally   Drug use: No     Vonna Sharlet POUR, MD 05/25/24 2031  "

## 2024-05-25 NOTE — Discharge Instructions (Signed)
 You have been given a Tdap vaccination to boost your tetanus immunity  The glue will slough off in the next few days.

## 2024-05-28 NOTE — ED Provider Notes (Signed)
 " [facility]    CSN: [id number] Arrival date & time: 05/19/24  1536      History   Chief Complaint Chief Complaint  Patient presents with   Pleurisy    HPI Hang Ammon is a 49 y.o. female.   Pt presents today due 1 week of right sided chest pain, fatigue, and weakness. Pt states that she believes that she may have had the flu 1 week ago. Pt states that she took Coricidin for symptoms with no relief. Pt denies fever or shortness of breath.   The history is provided by the patient.    Past Medical History:  Diagnosis Date   Anxiety    Depression    GERD (gastroesophageal reflux disease)    Headache    Hypertension    Sleep apnea    no c-pap    Patient Active Problem List   Diagnosis Date Noted   Intractable migraine without aura and without status migrainosus 01/13/2015   BPPV (benign paroxysmal positional vertigo) 01/13/2015   Essential hypertension 01/13/2015   Tobacco abuse 01/13/2015   Depression 01/13/2015    Past Surgical History:  Procedure Laterality Date   ABDOMINAL HYSTERECTOMY     BACK SURGERY     CHOLECYSTECTOMY     COLONOSCOPY     UPPER GASTROINTESTINAL ENDOSCOPY      OB History     Gravida  0   Para  0   Term  0   Preterm  0   AB  0   Living         SAB  0   IAB  0   Ectopic  0   Multiple      Live Births               Home Medications    Prior to Admission medications  Medication Sig Start Date End Date Taking? Authorizing Provider  acetaminophen (TYLENOL) 500 MG tablet Take 1,000 mg by mouth every 6 (six) hours as needed for moderate pain.   Yes [provider]  benzonatate  (TESSALON ) 100 MG capsule Take 1 capsule (100 mg total) by mouth every 8 (eight) hours. 05/19/24  Yes Andra Corean BROCKS, PA-C  guaiFENesin  (MUCINEX ) 600 MG 12 hr tablet Take 1 tablet (600 mg total) by mouth 2 (two) times daily for 10 days. 05/19/24 05/29/24 Yes Andra Corean C, PA-C  omeprazole -sodium  bicarbonate (ZEGERID ) 40-1100 MG capsule Take 1 capsule by mouth daily before breakfast. Patient will need appointment for further refills. 11/11/17  Yes Teressa Toribio SQUIBB, MD  predniSONE  (DELTASONE ) 50 MG tablet Take 1 tab po daily for 5 days Patient not taking: Reported on 05/25/2024 05/19/24  Yes Andra Corean BROCKS, PA-C  FLUoxetine  (PROZAC ) 40 MG capsule Take 40 mg by mouth daily. 03/30/16   [provider]  omeprazole -sodium bicarbonate  (ZEGERID ) 40-1100 MG per capsule Take 1 capsule by mouth. 08/07/14   Con Lauraine HERO, FNP    Family History Family History  Problem Relation Age of Onset   Depression Mother    Cancer Father        prostate    Alcohol abuse Brother    Cancer Maternal Grandmother        stomach   Stomach cancer Maternal Grandmother    Heart disease Maternal Grandfather    Heart failure Maternal Grandfather    Colon cancer Neg Hx    Esophageal cancer Neg Hx    Pancreatic cancer Neg Hx    Rectal cancer  Neg Hx     Social History Social History[1]   Allergies   Lisinopril, Losartan, and Lisinopril   Review of Systems Review of Systems   Physical Exam Triage Vital Signs ED Triage Vitals  Encounter Vitals Group     BP 05/19/24 1605 135/87     Girls Systolic BP Percentile --      Girls Diastolic BP Percentile --      Boys Systolic BP Percentile --      Boys Diastolic BP Percentile --      Pulse Rate 05/19/24 1605 80     Resp 05/19/24 1605 16     Temp 05/19/24 1605 98.2 F (36.8 C)     Temp Source 05/19/24 1605 Oral     SpO2 05/19/24 1605 98 %     Weight --      Height --      Head Circumference --      Peak Flow --      Pain Score 05/19/24 1600 5     Pain Loc --      Pain Education --      Exclude from Growth Chart --    No data found.  Updated Vital Signs BP 135/87 (BP Location: Right Arm)   Pulse 80   Temp 98.2 F (36.8 C) (Oral)   Resp 16   SpO2 98%   Visual Acuity Right Eye Distance:   Left Eye Distance:   Bilateral  Distance:    Right Eye Near:   Left Eye Near:    Bilateral Near:     Physical Exam Vitals and nursing note reviewed.  Constitutional:      General: She is not in acute distress.    Appearance: Normal appearance. She is not ill-appearing, toxic-appearing or diaphoretic.  Eyes:     General: No scleral icterus. Cardiovascular:     Rate and Rhythm: Normal rate and regular rhythm.     Heart sounds: Normal heart sounds.  Pulmonary:     Effort: Pulmonary effort is normal. No respiratory distress.     Breath sounds: Normal breath sounds. No wheezing or rhonchi.  Skin:    General: Skin is warm.  Neurological:     Mental Status: She is alert and oriented to person, place, and time.  Psychiatric:        Mood and Affect: Mood normal.        Behavior: Behavior normal.      UC Treatments / Results  Labs (all labs ordered are listed, but only abnormal results are displayed) Labs Reviewed - No data to display  EKG   Radiology No results found.  Procedures Procedures (including critical care time)  Medications Ordered in UC Medications - No data to display  Initial Impression / Assessment and Plan / UC Course  I have reviewed the triage vital signs and the nursing notes.  Pertinent labs & imaging results that were available during my care of the patient were reviewed by me and considered in my medical decision making (see chart for details).     Final Clinical Impressions(s) / UC Diagnoses   Final diagnoses:  Acute bronchitis, unspecified organism   Discharge Instructions   None    ED Prescriptions     Medication Sig Dispense Auth. Provider   predniSONE  (DELTASONE ) 50 MG tablet Take 1 tab po daily for 5 days Patient not taking:  Reported on 05/25/2024 5 tablet Andra Krabbe C, PA-C   guaiFENesin  (MUCINEX ) 600 MG 12 hr tablet  Take 1 tablet (600 mg total) by mouth 2 (two) times daily for 10 days. 20 tablet Andra Krabbe C, PA-C   benzonatate  (TESSALON ) 100  MG capsule Take 1 capsule (100 mg total) by mouth every 8 (eight) hours. 30 capsule Andra Krabbe BROCKS, PA-C      PDMP not reviewed this encounter.    [1]  Social History Tobacco Use   Smoking status: Every Day    Current packs/day: 0.25    Types: Cigarettes    Passive exposure: Current   Smokeless tobacco: Never   Tobacco comments:    patient is aware she needs to quit   Vaping Use   Vaping status: Never Used  Substance Use Topics   Alcohol use: Yes    Alcohol/week: 0.0 standard drinks of alcohol    Comment: 0ccaionally   Drug use: No     Andra Krabbe BROCKS, PA-C 05/28/24 0813  "
